# Patient Record
Sex: Male | Born: 1955 | Race: White | Hispanic: No | Marital: Married | Smoking: Never smoker
Health system: Southern US, Community
[De-identification: ages and names within clinical notes are randomized; demographics above are authoritative.]

## PROBLEM LIST (undated history)

## (undated) DIAGNOSIS — F341 Dysthymic disorder: Secondary | ICD-10-CM

## (undated) DIAGNOSIS — E559 Vitamin D deficiency, unspecified: Secondary | ICD-10-CM

## (undated) DIAGNOSIS — F0280 Dementia in other diseases classified elsewhere without behavioral disturbance: Secondary | ICD-10-CM

## (undated) DIAGNOSIS — E291 Testicular hypofunction: Secondary | ICD-10-CM

## (undated) DIAGNOSIS — C439 Malignant melanoma of skin, unspecified: Secondary | ICD-10-CM

## (undated) DIAGNOSIS — G629 Polyneuropathy, unspecified: Secondary | ICD-10-CM

## (undated) DIAGNOSIS — R413 Other amnesia: Secondary | ICD-10-CM

## (undated) DIAGNOSIS — H9190 Unspecified hearing loss, unspecified ear: Secondary | ICD-10-CM

## (undated) DIAGNOSIS — E7800 Pure hypercholesterolemia, unspecified: Secondary | ICD-10-CM

## (undated) HISTORY — DX: Malignant melanoma of skin, unspecified: C43.9

## (undated) HISTORY — DX: Dementia in other diseases classified elsewhere, unspecified severity, without behavioral disturbance, psychotic disturbance, mood disturbance, and anxiety: F02.80

## (undated) HISTORY — DX: Dysthymic disorder: F34.1

## (undated) HISTORY — DX: Pure hypercholesterolemia, unspecified: E78.00

## (undated) HISTORY — DX: Polyneuropathy, unspecified: G62.9

## (undated) HISTORY — DX: Vitamin D deficiency, unspecified: E55.9

## (undated) HISTORY — DX: Testicular hypofunction: E29.1

## (undated) HISTORY — PX: MELANOMA EXCISION: SHX5266

## (undated) HISTORY — DX: Unspecified hearing loss, unspecified ear: H91.90

## (undated) HISTORY — DX: Other amnesia: R41.3

---

## 2012-09-15 DIAGNOSIS — G629 Polyneuropathy, unspecified: Secondary | ICD-10-CM | POA: Insufficient documentation

## 2014-03-30 DIAGNOSIS — Q453 Other congenital malformations of pancreas and pancreatic duct: Secondary | ICD-10-CM | POA: Insufficient documentation

## 2015-09-01 DIAGNOSIS — E291 Testicular hypofunction: Secondary | ICD-10-CM | POA: Insufficient documentation

## 2016-08-28 DIAGNOSIS — F341 Dysthymic disorder: Secondary | ICD-10-CM | POA: Insufficient documentation

## 2018-06-02 DIAGNOSIS — Z86018 Personal history of other benign neoplasm: Secondary | ICD-10-CM

## 2018-06-02 HISTORY — DX: Personal history of other benign neoplasm: Z86.018

## 2019-01-05 ENCOUNTER — Encounter: Payer: Self-pay | Admitting: *Deleted

## 2019-01-09 ENCOUNTER — Encounter: Payer: Self-pay | Admitting: Neurology

## 2019-01-09 ENCOUNTER — Ambulatory Visit (INDEPENDENT_AMBULATORY_CARE_PROVIDER_SITE_OTHER): Payer: Medicare Other | Admitting: Neurology

## 2019-01-09 DIAGNOSIS — G3184 Mild cognitive impairment, so stated: Secondary | ICD-10-CM

## 2019-01-09 NOTE — Progress Notes (Signed)
PATIENT: Frederick Cook DOB: 12/07/1956  Chief Complaint  Patient presents with  . Mild Cognitive Impairment    MMSE 26/30 - 13 animals.  He is here with his wife, Santiago Glad, to establish new care.  Recently moved from [place].   Marland Kitchen PCP    Tisovec, Fransico Him, MD     HISTORICAL  Frederick Cook is a 63 year old male, accompanied by his wife Santiago Glad, seen in request by his primary care physician Dr. Osborne Casco, Fransico Him for evaluation of cognitive impairment, initial evaluation was on January 09, 2019  I have reviewed and summarized the referring note from the referring physician.  He had a history of peripheral neuropathy, vitamin D deficiency, hypogonadism, melanoma scalp, back of neck, recently moved to [place] from [place] in August 2019.  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 medications, he denies significant improvement in his memory loss, had slow decline in the past few years, he is no longer working.  I also reviewed previous evaluation note by Dr. 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, medial frontal cortex and parietal cortex MRI of the brain port from outside hospital in September 2019 no acute abnormality EEG July 23, 2017 was normal,  Moca score was 24/20, missed 5 out of 5 recall  Neuropsychiatric evaluation in 2018, identifies significant impairment in multiple cognitive domains, mild impairment of fine motor speed and coordination of his dominant right hand, visual attention, semantic and  phonemic verbal fluency, sustained attention, processing speed, anterograde memory, for both verbal and visual spatial information, he also reported several symptoms of depression, when compared to previous 2016 neuropsychological result, notable declines in the following cognitive domains, sustained attention, language, verbal learning, verbal fluency.  He just finished previous dementia trial in December 2019,  Strong family history of dementia, his mother, maternal grand father suffered dementia,   REVIEW OF SYSTEMS: Full 14 system review of systems performed and notable only for hearing loss, memory loss All other review of systems were negative.  ALLERGIES: No Known Allergies  HOME MEDICATIONS: No current outpatient medications on file.   No current facility-administered medications for this visit.     PAST MEDICAL HISTORY: Past Medical History:  Diagnosis Date  . Dysthymia   . Hearing loss   . High cholesterol   . Hypogonadism in male   . Melanoma ([place])    scalp, back of neck  . Memory loss   . Neuropathy   . Vitamin D deficiency     PAST SURGICAL HISTORY: Past Surgical History:  Procedure Laterality Date  . MELANOMA EXCISION     x 4    FAMILY HISTORY: Family History  Problem Relation Age of Onset  . High Cholesterol Mother   . Alzheimer's disease Mother   . Hypertension Father   . Heart disease Father        pacemaker  . Breast cancer Sister   . Bladder Cancer Sister     SOCIAL HISTORY: Social History   Socioeconomic History  . Marital status: Married  Spouse name: Not on file  . Number of children: 2  . Years of education: MBA  . Highest education level: Master's degree (e.g., MA, MS, MEng, MEd, MSW, MBA)  Occupational History  . Occupation: Retired  Scientific laboratory technician  . Financial resource strain: Not on file  . Food insecurity:    Worry: Not on file    Inability: Not on file  . Transportation needs:    Medical: Not on file    Non-medical: Not  on file  Tobacco Use  . Smoking status: Never Smoker  . Smokeless tobacco: Never Used  Substance and Sexual Activity  . Alcohol use: Yes    Comment: one drink per week  . Drug use: Never  . Sexual activity: Not on file  Lifestyle  . Physical activity:    Days per week: Not on file    Minutes per session: Not on file  . Stress: Not on file  Relationships  . Social connections:    Talks on phone: Not on file    Gets together: Not on file    Attends religious service: Not on file    Active member of club or organization: Not on file    Attends meetings of clubs or organizations: Not on file    Relationship status: Not on file  . Intimate partner violence:    Fear of current or ex partner: Not on file    Emotionally abused: Not on file    Physically abused: Not on file    Forced sexual activity: Not on file  Other Topics Concern  . Not on file  Social History Narrative   Left-handed.   Lives at home with his wife, Santiago Glad.   3 cups caffeine per day.     PHYSICAL EXAM   Vitals:   01/09/19 0803  Weight: 185 lb 8 oz (84.1 kg)  Height: 6\' 3"  (1.905 m)    Not recorded      Body mass index is 23.19 kg/m.  PHYSICAL EXAMNIATION:  Gen: NAD, conversant, well nourised, obese, well groomed                     Cardiovascular: Regular rate rhythm, no peripheral edema, warm, nontender. Eyes: Conjunctivae clear without exudates or hemorrhage Neck: Supple, no carotid bruits. Pulmonary: Clear to auscultation bilaterally   NEUROLOGICAL EXAM:  MMSE - Mini Mental State Exam 01/09/2019  Orientation to time 4  Orientation to Place 5  Registration 3  Attention/ Calculation 5  Recall 0  Language- name 2 objects 2  Language- repeat 1  Language- follow 3 step command 3  Language- read & follow direction 1  Write a sentence 1  Copy design 1  Total score 26  animal naming 14   CRANIAL NERVES: CN II: Visual fields are full to confrontation. Fundoscopic exam is normal with sharp  discs and no vascular changes. Pupils are round equal and briskly reactive to light. CN III, IV, VI: extraocular movement are normal. No ptosis. CN V: Facial sensation is intact to pinprick in all 3 divisions bilaterally. Corneal responses are intact.  CN VII: Face is symmetric with normal eye closure and smile. CN VIII: Hearing is normal to rubbing fingers CN IX, X: Palate elevates symmetrically. Phonation is normal. CN XI: Head turning and shoulder shrug are intact CN XII: Tongue is midline with normal movements and no atrophy.  MOTOR: There is no pronator drift of out-stretched arms. Muscle bulk and tone are normal. Muscle strength  is normal.  REFLEXES: Reflexes are 2+ and symmetric at the biceps, triceps, knees, and ankles. Plantar responses are flexor.  SENSORY: Intact to light touch, pinprick, positional sensation and vibratory sensation are intact in fingers and toes.  COORDINATION: Rapid alternating movements and fine finger movements are intact. There is no dysmetria on finger-to-nose and heel-knee-shin.    GAIT/STANCE: Posture is normal. Gait is steady with normal steps, base, arm swing, and turning. Heel and toe walking are normal. Tandem gait is normal.  Romberg is absent.   DIAGNOSTIC DATA (LABS, IMAGING, TESTING) - I reviewed patient records, labs, notes, testing and imaging myself where available.   ASSESSMENT AND PLAN  Kamerin Grumbine is a 63 y.o. male   Dementia  Strong family history of dementia, mother, maternal grandfather suffered dementia,  Was enrolled in a clinical trial in the past, had positive PET scan for amyloid,  Neuropsychological evaluation also confirmed progression of cognitive impairment,  I have encouraged him continue moderate exercise, laboratory evaluation by his primary care physician  May consider Namenda, Aricept once his insurance issue is clarified   Marcial Pacas, M.D. Ph.D.  [facility] [address] [place], [place] [address] Ph: [phone] Fax: [phone]  CC: Tisovec, Fransico Him, MD

## 2019-04-28 DIAGNOSIS — G3184 Mild cognitive impairment, so stated: Secondary | ICD-10-CM | POA: Diagnosis not present

## 2019-04-28 DIAGNOSIS — E7800 Pure hypercholesterolemia, unspecified: Secondary | ICD-10-CM | POA: Diagnosis not present

## 2019-04-28 DIAGNOSIS — R946 Abnormal results of thyroid function studies: Secondary | ICD-10-CM | POA: Diagnosis not present

## 2019-04-28 DIAGNOSIS — F341 Dysthymic disorder: Secondary | ICD-10-CM | POA: Diagnosis not present

## 2019-04-28 DIAGNOSIS — E291 Testicular hypofunction: Secondary | ICD-10-CM | POA: Diagnosis not present

## 2019-04-28 DIAGNOSIS — E559 Vitamin D deficiency, unspecified: Secondary | ICD-10-CM | POA: Diagnosis not present

## 2019-05-01 DIAGNOSIS — R946 Abnormal results of thyroid function studies: Secondary | ICD-10-CM | POA: Diagnosis not present

## 2019-06-08 DIAGNOSIS — Z85828 Personal history of other malignant neoplasm of skin: Secondary | ICD-10-CM | POA: Diagnosis not present

## 2019-06-08 DIAGNOSIS — Z1283 Encounter for screening for malignant neoplasm of skin: Secondary | ICD-10-CM | POA: Diagnosis not present

## 2019-06-08 DIAGNOSIS — Z85820 Personal history of malignant melanoma of skin: Secondary | ICD-10-CM | POA: Diagnosis not present

## 2019-06-08 DIAGNOSIS — D045 Carcinoma in situ of skin of trunk: Secondary | ICD-10-CM | POA: Diagnosis not present

## 2019-06-08 DIAGNOSIS — C4492 Squamous cell carcinoma of skin, unspecified: Secondary | ICD-10-CM

## 2019-06-08 DIAGNOSIS — D485 Neoplasm of uncertain behavior of skin: Secondary | ICD-10-CM | POA: Diagnosis not present

## 2019-06-08 DIAGNOSIS — D2239 Melanocytic nevi of other parts of face: Secondary | ICD-10-CM | POA: Diagnosis not present

## 2019-06-08 HISTORY — DX: Squamous cell carcinoma of skin, unspecified: C44.92

## 2019-07-20 DIAGNOSIS — D045 Carcinoma in situ of skin of trunk: Secondary | ICD-10-CM | POA: Diagnosis not present

## 2019-07-20 DIAGNOSIS — L720 Epidermal cyst: Secondary | ICD-10-CM | POA: Diagnosis not present

## 2019-08-11 DIAGNOSIS — Z23 Encounter for immunization: Secondary | ICD-10-CM | POA: Diagnosis not present

## 2019-09-06 ENCOUNTER — Inpatient Hospital Stay (HOSPITAL_BASED_OUTPATIENT_CLINIC_OR_DEPARTMENT_OTHER)
Admission: EM | Admit: 2019-09-06 | Discharge: 2019-09-08 | DRG: 312 | Disposition: A | Discharge status: Home / Self Care | Payer: Medicare Other | Attending: Internal Medicine | Admitting: Internal Medicine

## 2019-09-06 ENCOUNTER — Emergency Department (HOSPITAL_BASED_OUTPATIENT_CLINIC_OR_DEPARTMENT_OTHER): Payer: Medicare Other

## 2019-09-06 ENCOUNTER — Other Ambulatory Visit: Payer: Self-pay

## 2019-09-06 ENCOUNTER — Encounter (HOSPITAL_BASED_OUTPATIENT_CLINIC_OR_DEPARTMENT_OTHER): Payer: Self-pay

## 2019-09-06 DIAGNOSIS — H9190 Unspecified hearing loss, unspecified ear: Secondary | ICD-10-CM | POA: Diagnosis present

## 2019-09-06 DIAGNOSIS — Z803 Family history of malignant neoplasm of breast: Secondary | ICD-10-CM

## 2019-09-06 DIAGNOSIS — I951 Orthostatic hypotension: Secondary | ICD-10-CM | POA: Diagnosis not present

## 2019-09-06 DIAGNOSIS — Z8673 Personal history of transient ischemic attack (TIA), and cerebral infarction without residual deficits: Secondary | ICD-10-CM

## 2019-09-06 DIAGNOSIS — F0280 Dementia in other diseases classified elsewhere without behavioral disturbance: Secondary | ICD-10-CM | POA: Diagnosis not present

## 2019-09-06 DIAGNOSIS — S0181XA Laceration without foreign body of other part of head, initial encounter: Secondary | ICD-10-CM | POA: Diagnosis present

## 2019-09-06 DIAGNOSIS — E7800 Pure hypercholesterolemia, unspecified: Secondary | ICD-10-CM | POA: Diagnosis not present

## 2019-09-06 DIAGNOSIS — S0180XA Unspecified open wound of other part of head, initial encounter: Secondary | ICD-10-CM | POA: Diagnosis not present

## 2019-09-06 DIAGNOSIS — R55 Syncope and collapse: Secondary | ICD-10-CM | POA: Diagnosis present

## 2019-09-06 DIAGNOSIS — Z820 Family history of epilepsy and other diseases of the nervous system: Secondary | ICD-10-CM | POA: Diagnosis not present

## 2019-09-06 DIAGNOSIS — E860 Dehydration: Secondary | ICD-10-CM | POA: Diagnosis present

## 2019-09-06 DIAGNOSIS — G300 Alzheimer's disease with early onset: Secondary | ICD-10-CM | POA: Diagnosis present

## 2019-09-06 DIAGNOSIS — Z8052 Family history of malignant neoplasm of bladder: Secondary | ICD-10-CM

## 2019-09-06 DIAGNOSIS — Z8349 Family history of other endocrine, nutritional and metabolic diseases: Secondary | ICD-10-CM | POA: Diagnosis not present

## 2019-09-06 DIAGNOSIS — Y92009 Unspecified place in unspecified non-institutional (private) residence as the place of occurrence of the external cause: Secondary | ICD-10-CM

## 2019-09-06 DIAGNOSIS — R7989 Other specified abnormal findings of blood chemistry: Secondary | ICD-10-CM | POA: Diagnosis not present

## 2019-09-06 DIAGNOSIS — R413 Other amnesia: Secondary | ICD-10-CM | POA: Diagnosis not present

## 2019-09-06 DIAGNOSIS — E291 Testicular hypofunction: Secondary | ICD-10-CM | POA: Diagnosis present

## 2019-09-06 DIAGNOSIS — W19XXXA Unspecified fall, initial encounter: Secondary | ICD-10-CM | POA: Diagnosis present

## 2019-09-06 DIAGNOSIS — Z8249 Family history of ischemic heart disease and other diseases of the circulatory system: Secondary | ICD-10-CM

## 2019-09-06 DIAGNOSIS — Z03818 Encounter for observation for suspected exposure to other biological agents ruled out: Secondary | ICD-10-CM | POA: Diagnosis not present

## 2019-09-06 DIAGNOSIS — E785 Hyperlipidemia, unspecified: Secondary | ICD-10-CM | POA: Diagnosis present

## 2019-09-06 DIAGNOSIS — Z20828 Contact with and (suspected) exposure to other viral communicable diseases: Secondary | ICD-10-CM | POA: Diagnosis present

## 2019-09-06 DIAGNOSIS — S0990XA Unspecified injury of head, initial encounter: Secondary | ICD-10-CM | POA: Diagnosis not present

## 2019-09-06 DIAGNOSIS — W19XXXD Unspecified fall, subsequent encounter: Secondary | ICD-10-CM | POA: Diagnosis not present

## 2019-09-06 DIAGNOSIS — Z85820 Personal history of malignant melanoma of skin: Secondary | ICD-10-CM

## 2019-09-06 DIAGNOSIS — R079 Chest pain, unspecified: Secondary | ICD-10-CM | POA: Diagnosis not present

## 2019-09-06 LAB — CBC
HCT: 40.5 % (ref 39.0–52.0)
Hemoglobin: 13.8 g/dL (ref 13.0–17.0)
MCH: 31.7 pg (ref 26.0–34.0)
MCHC: 34.1 g/dL (ref 30.0–36.0)
MCV: 92.9 fL (ref 80.0–100.0)
Platelets: 180 10*3/uL (ref 150–400)
RBC: 4.36 MIL/uL (ref 4.22–5.81)
RDW: 12.1 % (ref 11.5–15.5)
WBC: 10.3 10*3/uL (ref 4.0–10.5)
nRBC: 0 % (ref 0.0–0.2)

## 2019-09-06 LAB — CBG MONITORING, ED: Glucose-Capillary: 94 mg/dL (ref 70–99)

## 2019-09-06 LAB — SARS CORONAVIRUS 2 BY RT PCR (HOSPITAL ORDER, PERFORMED IN ~~LOC~~ HOSPITAL LAB): SARS Coronavirus 2: NEGATIVE

## 2019-09-06 LAB — URINALYSIS, ROUTINE W REFLEX MICROSCOPIC
Bilirubin Urine: NEGATIVE
Glucose, UA: NEGATIVE mg/dL
Hgb urine dipstick: NEGATIVE
Ketones, ur: NEGATIVE mg/dL
Leukocytes,Ua: NEGATIVE
Nitrite: NEGATIVE
Protein, ur: NEGATIVE mg/dL
Specific Gravity, Urine: 1.02 (ref 1.005–1.030)
pH: 6 (ref 5.0–8.0)

## 2019-09-06 LAB — TROPONIN I (HIGH SENSITIVITY)
Troponin I (High Sensitivity): 2 ng/L (ref <18)
Troponin I (High Sensitivity): 2 ng/L (ref <18)

## 2019-09-06 LAB — BASIC METABOLIC PANEL
Anion gap: 10 (ref 5–15)
BUN: 18 mg/dL (ref 8–23)
CO2: 26 mmol/L (ref 22–32)
Calcium: 9.7 mg/dL (ref 8.9–10.3)
Chloride: 100 mmol/L (ref 98–111)
Creatinine, Ser: 0.89 mg/dL (ref 0.61–1.24)
GFR calc Af Amer: >60 mL/min (ref >60)
GFR calc non Af Amer: >60 mL/min (ref >60)
Glucose, Bld: 108 mg/dL — ABNORMAL HIGH (ref 70–99)
Potassium: 4.5 mmol/L (ref 3.5–5.1)
Sodium: 136 mmol/L (ref 135–145)

## 2019-09-06 LAB — D-DIMER, QUANTITATIVE: D-Dimer, Quant: 1.1 ug/mL-FEU — ABNORMAL HIGH (ref 0.00–0.50)

## 2019-09-06 LAB — MRSA PCR SCREENING: MRSA by PCR: NEGATIVE

## 2019-09-06 MED ORDER — SODIUM CHLORIDE 0.9% FLUSH
3.0000 mL | Freq: Once | INTRAVENOUS | Status: DC
  Filled 2019-09-06: qty 3

## 2019-09-06 MED ORDER — SODIUM CHLORIDE 0.9 % IV SOLN
INTRAVENOUS | Status: DC
  Administered 2019-09-07 – 2019-09-08 (×4): via INTRAVENOUS

## 2019-09-06 MED ORDER — ONDANSETRON HCL 4 MG PO TABS: 4.0000 mg | ORAL_TABLET | Freq: Four times a day (QID) | ORAL | Status: DC | PRN

## 2019-09-06 MED ORDER — ACETAMINOPHEN 325 MG PO TABS: 650.0000 mg | ORAL_TABLET | Freq: Four times a day (QID) | ORAL | Status: DC | PRN

## 2019-09-06 MED ORDER — LIDOCAINE HCL 2 % IJ SOLN
10.0000 mL | Freq: Once | INTRAMUSCULAR | Status: AC
  Administered 2019-09-06: 200 mg
  Filled 2019-09-06: qty 20

## 2019-09-06 MED ORDER — ACETAMINOPHEN 650 MG RE SUPP: 650.0000 mg | Freq: Four times a day (QID) | RECTAL | Status: DC | PRN

## 2019-09-06 MED ORDER — ENOXAPARIN SODIUM 40 MG/0.4ML ~~LOC~~ SOLN: 40.0000 mg | SUBCUTANEOUS | Status: DC

## 2019-09-06 MED ORDER — ONDANSETRON HCL 4 MG/2ML IJ SOLN: 4.0000 mg | Freq: Four times a day (QID) | INTRAMUSCULAR | Status: DC | PRN

## 2019-09-06 MED ORDER — SODIUM CHLORIDE 0.9% FLUSH
3.0000 mL | Freq: Two times a day (BID) | INTRAVENOUS | Status: DC
  Administered 2019-09-07: 3 mL via INTRAVENOUS

## 2019-09-06 MED ORDER — IOHEXOL 350 MG/ML SOLN
100.0000 mL | Freq: Once | INTRAVENOUS | Status: AC
  Administered 2019-09-06: 100 mL via INTRAVENOUS

## 2019-09-06 NOTE — H&P (Signed)
History and Physical    Frederick Cook [id number] DOB: January 13, 1956 DOA: 09/06/2019  Referring MD/NP/PA:   PCP: Haywood Pao, MD   Patient coming from:  The patient is coming from home.  At baseline, pt is independent for most of ADL.        Chief Complaint: syncope  HPI: Frederick Cook is a 63 y.o. male with medical history significant of HLD, memory loss, hypogonadism, who presents with syncope.  Pt states that he passed out for less than 1 minwhen he was standing in front of sink after taking a shower. He fall and injured his forehead, caused laceration to his forehead. Patient denies any prodromal symptoms.  No unilateral weakness, numbness or tingling his extremities been no facial droop, slurred speech, vision loss or new hearing change. No seizure activity.  Patient denies any chest pain, shortness of breath, cough, fever or chills.  No nausea vomiting, diarrhea, abdominal pain, symptoms of UTI.  Patient states that he has history of orthostatic status. He states that had a negative MRI of brain last week in [facility] (I could not find the report).  ED Course: pt was found to have negative troponin x2, negative COVID-19 test, d-dimer 1.10, WBC 10.3, electrolytes renal function okay, temperature normal, blood pressure 106/89, heart rate 57, 82, oxygen saturation 100% on room air.  Chest x-ray negative.  CT angiogram is negative for PE.  Patient is placed on telemetry bed for observation.  # CT-head: Frontal and parietal lobe atrophy. Minimal periventricular small vessel disease. 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 No acute infarct evident. No mass or hemorrhage.  Review of Systems:   General: no fevers, chills, no body weight gain, has fatigue HEENT: no blurry vision, hearing changes or sore throat Respiratory: no dyspnea, coughing, wheezing CV: no chest pain, no palpitations GI: no nausea, vomiting, abdominal pain, diarrhea,  constipation GU: no dysuria, burning on urination, increased urinary frequency, hematuria  Ext: no leg edema Neuro: no unilateral weakness, numbness, or tingling, no vision change or hearing loss. Had syncope and fall. Skin: no rash, has laceration to forehead MSK: No muscle spasm, no deformity, no limitation of range of movement in spin Heme: No easy bruising.  Travel history: No recent long distant travel.  Allergy: No Known Allergies  Past Medical History:  Diagnosis Date   Dysthymia    Hearing loss    High cholesterol    Hypogonadism in male    Melanoma ([place])    scalp, back of neck   Memory loss    Neuropathy    Vitamin D deficiency     Past Surgical History:  Procedure Laterality Date   MELANOMA EXCISION     x 4    Social History:  reports that he has never smoked. He has never used smokeless tobacco. He reports current alcohol use. He reports that he does not use drugs.  Family History:  Family History  Problem Relation Age of Onset   High Cholesterol Mother    Alzheimer's disease Mother    Hypertension Father    Heart disease Father        pacemaker   Breast cancer Sister    Bladder Cancer Sister      Prior to Admission medications   Not on File    Physical Exam: Vitals:   09/06/19 2020 09/06/19 2100 09/06/19 2154 09/06/19 2334  BP: 125/76 125/69 106/89 111/71  Pulse: (!) 57 (!) 59  82 63  Resp: 20 18 12 19   Temp:   98.2 F (36.8 C) 98 F (36.7 C)  TempSrc:   Oral Oral  SpO2: 100% 100% 100% 98%  Weight:   81.3 kg   Height:   6\' 5"  (1.956 m)    General: Not in acute distress HEENT:       Eyes: PERRL, EOMI, no scleral icterus.       ENT: No discharge from the ears and nose, no pharynx injection, no tonsillar enlargement.        Neck: No JVD, no bruit, no mass felt. Heme: No neck lymph node enlargement. Cardiac: S1/S2, RRR, No murmurs, No gallops or rubs. Respiratory: Good air movement bilaterally. No rales, wheezing, rhonchi or  rubs. GI: Soft, nondistended, nontender, no rebound pain, no organomegaly, BS present. GU: No hematuria Ext: No pitting leg edema bilaterally. 2+DP/PT pulse bilaterally. Musculoskeletal: No joint deformities, No joint redness or warmth, no limitation of ROM in spin. Skin: No rashes. has laceration to forehead. Neuro: Alert, oriented X3, cranial nerves II-XII grossly intact, moves all extremities normally. Muscle strength 5/5 in all extremities, sensation to light touch intact. Brachial reflex 2+ bilaterally. Negative Babinski's sign. Normal finger to nose test. Psych: Patient is not psychotic, no suicidal or hemocidal ideation.  Labs on Admission: I have personally reviewed following labs and imaging studies  CBC: Recent Labs  Lab 09/06/19 1447  WBC 10.3  HGB 13.8  HCT 40.5  MCV 92.9  PLT 99991111   Basic Metabolic Panel: Recent Labs  Lab 09/06/19 1447  NA 136  K 4.5  CL 100  CO2 26  GLUCOSE 108*  BUN 18  CREATININE 0.89  CALCIUM 9.7   GFR: Estimated Creatinine Clearance: 95.2 mL/min (by C-G formula based on SCr of 0.89 mg/dL). Liver Function Tests: No results for input(s): AST, ALT, ALKPHOS, BILITOT, PROT, ALBUMIN in the last 168 hours. No results for input(s): LIPASE, AMYLASE in the last 168 hours. No results for input(s): AMMONIA in the last 168 hours. Coagulation Profile: No results for input(s): INR, PROTIME in the last 168 hours. Cardiac Enzymes: No results for input(s): CKTOTAL, CKMB, CKMBINDEX, TROPONINI in the last 168 hours. BNP (last 3 results) No results for input(s): PROBNP in the last 8760 hours. HbA1C: No results for input(s): HGBA1C in the last 72 hours. CBG: Recent Labs  Lab 09/06/19 1451  GLUCAP 94   Lipid Profile: No results for input(s): CHOL, HDL, LDLCALC, TRIG, CHOLHDL, LDLDIRECT in the last 72 hours. Thyroid Function Tests: No results for input(s): TSH, T4TOTAL, FREET4, T3FREE, THYROIDAB in the last 72 hours. Anemia Panel: No results for  input(s): VITAMINB12, FOLATE, FERRITIN, TIBC, IRON, RETICCTPCT in the last 72 hours. Urine analysis:    Component Value Date/Time   COLORURINE YELLOW 09/06/2019 [place] 09/06/2019 1447   LABSPEC 1.020 09/06/2019 1447   PHURINE 6.0 09/06/2019 1447   GLUCOSEU NEGATIVE 09/06/2019 1447   HGBUR NEGATIVE 09/06/2019 [place] 09/06/2019 [place] 09/06/2019 1447   PROTEINUR NEGATIVE 09/06/2019 1447   NITRITE NEGATIVE 09/06/2019 1447   LEUKOCYTESUR NEGATIVE 09/06/2019 1447   Sepsis Labs: @LABRCNTIP (procalcitonin:4,lacticidven:4) ) Recent Results (from the past 240 hour(s))  SARS Coronavirus 2 [facility] Series order, Performed in [facility] hospital lab) Nasopharyngeal Nasopharyngeal Swab     Status: None   Collection Time: 09/06/19  3:49 PM   Specimen: Nasopharyngeal Swab  Result Value Ref Range Status   SARS Coronavirus 2 NEGATIVE NEGATIVE Final  Comment: (NOTE) If result is NEGATIVE SARS-CoV-2 target nucleic acids are NOT DETECTED. The SARS-CoV-2 RNA is generally detectable in upper and lower  respiratory specimens during the acute phase of infection. The lowest  concentration of SARS-CoV-2 viral copies this assay can detect is 250  copies / mL. A negative result does not preclude SARS-CoV-2 infection  and should not be used as the sole basis for treatment or other  patient management decisions.  A negative result may occur with  improper specimen collection / handling, submission of specimen other  than nasopharyngeal swab, presence of viral mutation(s) within the  areas targeted by this assay, and inadequate number of viral copies  (<250 copies / mL). A negative result must be combined with clinical  observations, patient history, and epidemiological information. If result is POSITIVE SARS-CoV-2 target nucleic acids are DETECTED. The SARS-CoV-2 RNA is generally detectable in upper and lower  respiratory specimens dur ing the acute  phase of infection.  Positive  results are indicative of active infection with SARS-CoV-2.  Clinical  correlation with patient history and other diagnostic information is  necessary to determine patient infection status.  Positive results do  not rule out bacterial infection or co-infection with other viruses. If result is PRESUMPTIVE POSTIVE SARS-CoV-2 nucleic acids MAY BE PRESENT.   A presumptive positive result was obtained on the submitted specimen  and confirmed on repeat testing.  While 2019 novel coronavirus  (SARS-CoV-2) nucleic acids may be present in the submitted sample  additional confirmatory testing may be necessary for epidemiological  and / or clinical management purposes  to differentiate between  SARS-CoV-2 and other Sarbecovirus currently known to infect humans.  If clinically indicated additional testing with an alternate test  methodology [phone]) is advised. The SARS-CoV-2 RNA is generally  detectable in upper and lower respiratory sp ecimens during the acute  phase of infection. The expected result is Negative. Fact Sheet for Patients:  StrictlyIdeas.no Fact Sheet for Healthcare Providers: BankingDealers.co.za This test is not yet approved or cleared by the [place] FDA and has been authorized for detection and/or diagnosis of SARS-CoV-2 by FDA under an Emergency Use Authorization (EUA).  This EUA will remain in effect (meaning this test can be used) for the duration of the COVID-19 declaration under Section 564(b)(1) of the Act, 21 U.S.C. section 360bbb-3(b)(1), unless the authorization is terminated or revoked sooner. Performed at [facility], [address] [place], [place] [address]   MRSA PCR Screening     Status: None   Collection Time: 09/06/19  9:54 PM   Specimen: Nasal Mucosa; Nasopharyngeal  Result Value Ref Range Status   MRSA by PCR NEGATIVE NEGATIVE Final    Comment:        The  GeneXpert MRSA Assay (FDA approved for NASAL specimens only), is one component of a comprehensive MRSA colonization surveillance program. It is not intended to diagnose MRSA infection nor to guide or monitor treatment for MRSA infections. Performed at [facility], [place] [address] [place], [place] [address]      Radiological Exams on Admission: Dg Chest 2 View  Result Date: 09/06/2019 CLINICAL DATA:  63 year old male with syncope. EXAM: CHEST - 2 VIEW COMPARISON:  None. FINDINGS: The heart size and mediastinal contours are within normal limits. Both lungs are clear. The visualized skeletal structures are unremarkable. IMPRESSION: No active cardiopulmonary disease. Electronically Signed   By: Anner Crete M.D.   On: 09/06/2019 15:39   Ct Head Wo  Contrast  Result Date: 09/06/2019 CLINICAL DATA:  Syncope with fall EXAM: CT HEAD WITHOUT CONTRAST TECHNIQUE: Contiguous axial images were obtained from the base of the skull through the vertex without intravenous contrast. COMPARISON:  None. FINDINGS: Brain: There is moderate frontal and parietal lobe atrophy bilaterally. The ventricles are normal in size and configuration for age. There is no intracranial mass, hemorrhage, extra-axial fluid collection, or midline shift. There is rather minimal small vessel disease in the centra semiovale bilaterally. There is evidence of a prior small lacunar type infarct in the anterior limb of the right internal capsule. No acute infarct evident. Vascular: There is no hyperdense vessel. There is no appreciable vascular calcification. Skull: The bony calvarium appears intact. Sinuses/Orbits: Visualized paranasal sinuses are clear. Visualized orbits appear symmetric bilaterally. Other: Visualized mastoid air cells are clear. IMPRESSION: Frontal and parietal lobe atrophy. Minimal periventricular small vessel disease. 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 No acute infarct  evident. No mass or hemorrhage. Electronically Signed   By: Lowella Grip III M.D.   On: 09/06/2019 15:41   Ct Angio Chest Pe W/cm &/or Wo Cm  Result Date: 09/06/2019 CLINICAL DATA:  Chest pain EXAM: CT ANGIOGRAPHY CHEST WITH CONTRAST TECHNIQUE: Multidetector CT imaging of the chest was performed using the standard protocol during bolus administration of intravenous contrast. Multiplanar CT image reconstructions and MIPs were obtained to evaluate the vascular anatomy. CONTRAST:  150mL OMNIPAQUE IOHEXOL 350 MG/ML SOLN COMPARISON:  Chest radiograph September 06, 2019 FINDINGS: Cardiovascular: There is no demonstrable pulmonary embolus. There is no thoracic aortic aneurysm or dissection. Visualized great vessels appear normal. There are foci of aortic atherosclerosis. There is no pericardial effusion or pericardial thickening. Mediastinum/Nodes: There is a 5 mm nodular opacity in the inferior aspect of the right lobe of the thyroid, a finding that does not warrant additional surveillance per consensus guidelines. Thyroid otherwise appears normal. There is no demonstrable thoracic adenopathy. No esophageal lesions are evident. Lungs/Pleura: There is no edema or consolidation. There is no appreciable pleural effusion. On axial slice 20 series 6, there is a 4 mm nodular opacity in the apical segment right upper lobe toward the posterior aspect. There is a small calcified granuloma in the posterior segment right upper lobe seen on axial slice 44 series 6. A second tiny calcified granuloma is seen in the right upper lobe anteriorly on axial slice 60 series 6. Upper Abdomen: There is left adrenal hypertrophy. Right adrenal appears normal. Visualized upper abdominal structures otherwise appear unremarkable. Musculoskeletal: There are no blastic or lytic bone lesions. No evident chest wall lesions. There are foci of degenerative change in the thoracic spine. Review of the MIP images confirms the above findings.  IMPRESSION: 1. No demonstrable pulmonary embolus. No thoracic aortic aneurysm or dissection. There are foci of aortic atherosclerosis. 2. No edema or consolidation. Occasional small calcified granulomas. 4 mm noncalcified nodular opacity in the apical segment right upper lobe. No follow-up needed if patient is low-risk. Non-contrast chest CT can be considered in 12 months if patient is high-risk. This recommendation follows the consensus statement: Guidelines for Management of Incidental Pulmonary Nodules Detected on CT Images: From the Fleischner Society 2017; Radiology 2017; 284:228-243. 3.  No evident thoracic adenopathy. 4. Left adrenal hypertrophy. This is a finding of questionable clinical significance. Right adrenal appears normal. Aortic Atherosclerosis (ICD10-I70.0). Electronically Signed   By: Lowella Grip III M.D.   On: 09/06/2019 17:23     EKG: Independently reviewed.  Sinus rhythm, QTC 417, bradycardia, nonspecific T wave change.  Assessment/Plan Principal Problem:   Syncope Active Problems:   High cholesterol   Memory loss   Fall   Syncope: Etiology is not clear. The differential diagnosis is broad, including vasovagal syncope, TIA, arrhythmia, ACS (less likely, given no chest pain and has two negative trop), drug abuse, orthostatic status (pt states that he has history of orthostatic issue), carotid artery stenosis.  Patient states that he had a negative MRI last week in [facility] (I cannot find the report).  Patient is hesitated to do MRI of brain, which is fine to me since patient does not have any focal neurologic findings on physical examination.  - Place on tele bed for obs - Orthostatic vital signs  - Carotid doppler - 2d echo - Neuro checks  - IVF: NS 100 cc/h - PT/OT eval and treat - will get LE venous doppler to r/o DVT due to positive D-dimer  Fall: due to syncope. Has forehead laceration. -would care consult  High cholesterol: -continue home statin (not  ordered, since pt cannot tell the dose and name, pending med reconciliation)  Memory loss:  -f/u with neuro in [facility]  DVT ppx: SQ Lovenox Code Status: Full code Family Communication: None at bed side.   Disposition Plan:  Anticipate discharge back to previous home environment Consults called:  none Admission status: Obs / tele     Date of Service 09/07/2019    [facility]   If 7PM-7AM, please contact night-coverage [email or web address] Password [facility] 09/07/2019, 12:28 AM

## 2019-09-06 NOTE — ED Notes (Signed)
Call to unit to provide report, unable to take report at this time.  Pt to be admitted pending covid result.

## 2019-09-06 NOTE — ED Notes (Signed)
[facility] notified (Tara) - patient ready for transport 

## 2019-09-06 NOTE — ED Notes (Signed)
[facility] will page hospitalist to have Bed Request completed

## 2019-09-06 NOTE — ED Notes (Signed)
Patient transported to CT 

## 2019-09-06 NOTE — ED Triage Notes (Signed)
Pt with c/o "blacked" out while in the [place] today-last recall taking a shower and was brushing his teeth-pt states he called for her and found pt slumped over sink/diaphoretic-pt with lac to forehead-pt does not have recall of hitting head-sent from PCP-pt NAD-steady gait

## 2019-09-06 NOTE — ED Notes (Signed)
Gave patient crackers and gingerale

## 2019-09-06 NOTE — ED Provider Notes (Signed)
[facility] EMERGENCY DEPARTMENT Provider Note   CSN: [id number] Arrival date & time: 09/06/19  1406     History   Chief Complaint Chief Complaint  Patient presents with  . Loss of Consciousness    HPI Frederick Cook is a 63 y.o. male.     HPI Patient has been well without any recent illness and minimal past medical history.  He had taken a shower and was brushing his teeth.  He had not felt lightheaded or overheated while showering.  Reports he was not in a long, hot shower.  He reports very suddenly he felt himself falling toward the sink and struck his head on the faucet.  He was able to support himself on the vanity and did not fall completely to the floor.  Patient reports he did not have any type of prodromal symptoms.  He reports he "felt fine, and then he was not".  He did not perceive his vision closing in or feeling weak or sweaty.  He was able to call his wife for help.  She reports when she got there he was very diaphoretic but not confused.  Within a few minutes he was starting to recover.  He reports now he just feels a little bit weak and shaky but otherwise back to normal.  He denies there was ever a headache, chest pain or focal weakness numbness or tingling of the extremities.  No recent new medications.  He has been started on a cholesterol medication about a month ago.  Patient otherwise has had no cardiac history, does not take antihypertensives, no drug or alcohol history.  At baseline he walks about 6 miles 3 times a week.  He denies any gets exertional chest pain or shortness of breath.  He does not feel lightheaded with exertion at baseline. Past Medical History:  Diagnosis Date  . Dysthymia   . Hearing loss   . High cholesterol   . Hypogonadism in male   . Melanoma ([place])    scalp, back of neck  . Memory loss   . Neuropathy   . Vitamin D deficiency     Patient Active Problem List   Diagnosis Date Noted  . Mild cognitive impairment 01/09/2019     Past Surgical History:  Procedure Laterality Date  . MELANOMA EXCISION     x 4        Home Medications    Prior to Admission medications   Not on File    Family History Family History  Problem Relation Age of Onset  . High Cholesterol Mother   . Alzheimer's disease Mother   . Hypertension Father   . Heart disease Father        pacemaker  . Breast cancer Sister   . Bladder Cancer Sister     Social History Social History   Tobacco Use  . Smoking status: Never Smoker  . Smokeless tobacco: Never Used  Substance Use Topics  . Alcohol use: Yes    Comment: one drink per week  . Drug use: Never     Allergies   Patient has no known allergies.   Review of Systems Review of Systems 10 Systems reviewed and are negative for acute change except as noted in the HPI.   Physical Exam Updated Vital Signs BP 121/69 (BP Location: Right Arm)   Pulse 64   Temp 97.8 F (36.6 C) (Oral)   Resp 14   Ht 6\' 4"  (1.93 m)   Wt 82.6  kg   SpO2 100%   BMI 22.15 kg/m   Physical Exam Constitutional:      Appearance: Normal appearance. He is well-developed.  HENT:     Head:     Comments: 2 cm vertically oriented laceration just above the right brow on the glabella.  This is gaping but not actively bleeding.  No large hematoma associated.  Face otherwise nontraumatic.    Nose: Nose normal.     Mouth/Throat:     Mouth: Mucous membranes are moist.     Pharynx: Oropharynx is clear.  Eyes:     Extraocular Movements: Extraocular movements intact.     Conjunctiva/sclera: Conjunctivae normal.     Pupils: Pupils are equal, round, and reactive to light.  Neck:     Musculoskeletal: Neck supple.     Comments: No C-spine tenderness. Cardiovascular:     Rate and Rhythm: Normal rate and regular rhythm.     Heart sounds: Normal heart sounds.  Pulmonary:     Effort: Pulmonary effort is normal.     Breath sounds: Normal breath sounds.  Abdominal:     General: Bowel sounds are  normal. There is no distension.     Palpations: Abdomen is soft.     Tenderness: There is no abdominal tenderness.  Musculoskeletal: Normal range of motion.  Skin:    General: Skin is warm and dry.  Neurological:     General: No focal deficit present.     Mental Status: He is alert and oriented to person, place, and time.     GCS: GCS eye subscore is 4. GCS verbal subscore is 5. GCS motor subscore is 6.     Cranial Nerves: No cranial nerve deficit.     Sensory: No sensory deficit.     Coordination: Coordination normal.  Psychiatric:        Mood and Affect: Mood normal.      ED Treatments / Results  Labs (all labs ordered are listed, but only abnormal results are displayed) Labs Reviewed  BASIC METABOLIC PANEL  CBC  URINALYSIS, ROUTINE W REFLEX MICROSCOPIC  CBG MONITORING, ED    EKG EKG Interpretation  Date/Time:  Wednesday September 06 2019 14:25:17 EDT Ventricular Rate:  59 PR Interval:  180 QRS Duration: 92 QT Interval:  422 QTC Calculation: 417 R Axis:   78 Text Interpretation:  Sinus bradycardia Early repolarization Otherwise normal ECG agree. no old Confirmed by Charlesetta Shanks [phone]) on 09/06/2019 3:00:20 PM   Radiology No results found.  Procedures .Marland KitchenLaceration Repair  Date/Time: 09/06/2019 3:41 PM Performed by: Charlesetta Shanks, MD Authorized by: Charlesetta Shanks, MD   Consent:    Consent obtained:  Verbal   Consent given by:  Patient   Risks discussed:  Infection, pain, poor cosmetic result and poor wound healing Anesthesia (see MAR for exact dosages):    Anesthesia method:  Local infiltration   Local anesthetic:  Lidocaine 2% w/o epi Laceration details:    Location:  Face   Face location:  Forehead   Length (cm):  2   Depth (mm):  5 Repair type:    Repair type:  Simple Pre-procedure details:    Preparation:  Patient was prepped and draped in usual sterile fashion Exploration:    Wound extent: muscle damage     Contaminated: no   Treatment:     Area cleansed with:  Betadine and saline   Amount of cleaning:  Standard Skin repair:    Repair method:  Sutures   Suture  size:  5-0   Suture technique:  Simple interrupted   Number of sutures:  5 Approximation:    Approximation:  Close Post-procedure details:    Dressing:  Antibiotic ointment   (including critical care time)  Medications Ordered in ED Medications  sodium chloride flush (NS) 0.9 % injection 3 mL (has no administration in time range)  lidocaine (XYLOCAINE) 2 % (with pres) injection 200 mg (has no administration in time range)     Initial Impression / Assessment and Plan / ED Course  I have reviewed the triage vital signs and the nursing notes.  Pertinent labs & imaging results that were available during my care of the patient were reviewed by me and considered in my medical decision making (see chart for details).  Clinical Course as of Sep 06 1441  Wed Sep 06, 2019  1806 Consult: Reviewed with Dr. Tamala Julian, advises does not need primary cardiology admission but reasonable for monitored observation on medical service.   [MP]    Clinical Course User Index [MP] Charlesetta Shanks, MD      Patient had syncopal episode without any prodromal symptoms.  He had been in the shower but denies a long hot shower.  He had not been standing for significantly prolonged period of time.  Patient reports he was brushing his teeth and with no preceding symptoms collapsed onto the sink top.  His mental status was oriented after the event.  Concern for possible cardiac dysrhythmia as etiology.  EKG shows early repolarization pattern.  No old available for comparison.  Patient has been stable emergency department.  Will plan for admission.  Facial laceration repaired as outlined.  Final Clinical Impressions(s) / ED Diagnoses   Final diagnoses:  Syncope, unspecified syncope type  Facial laceration, initial encounter    ED Discharge Orders    None       Charlesetta Shanks, MD  09/07/19 1444

## 2019-09-06 NOTE — ED Notes (Signed)
ED Provider at bedside. 

## 2019-09-06 NOTE — Progress Notes (Signed)
   Transfer from [facility] to [facility]  Mr. Frederick Cook is a 63 year old Caucasian male with no significant past medical history had an episode of syncope this morning.  Denies prodromal symptoms.  Basic labs such as CBC, BMP: WNL.  Troponin: Negative x2.  Chest x-ray: Negative.  CT head: Chronic changes, no acute findings.  D-dimer slightly elevated.  CT angiogram of the chest obtained which showed no pulmonary embolism.  EDP spoke to cardiology who recommended medical management and observation overnight for dysrhythmia.

## 2019-09-07 ENCOUNTER — Ambulatory Visit (HOSPITAL_BASED_OUTPATIENT_CLINIC_OR_DEPARTMENT_OTHER): Payer: Medicare Other

## 2019-09-07 ENCOUNTER — Observation Stay (HOSPITAL_BASED_OUTPATIENT_CLINIC_OR_DEPARTMENT_OTHER): Payer: Medicare Other

## 2019-09-07 DIAGNOSIS — I951 Orthostatic hypotension: Secondary | ICD-10-CM | POA: Diagnosis present

## 2019-09-07 DIAGNOSIS — Z85820 Personal history of malignant melanoma of skin: Secondary | ICD-10-CM | POA: Diagnosis not present

## 2019-09-07 DIAGNOSIS — E7800 Pure hypercholesterolemia, unspecified: Secondary | ICD-10-CM | POA: Diagnosis present

## 2019-09-07 DIAGNOSIS — E785 Hyperlipidemia, unspecified: Secondary | ICD-10-CM | POA: Diagnosis present

## 2019-09-07 DIAGNOSIS — Z803 Family history of malignant neoplasm of breast: Secondary | ICD-10-CM | POA: Diagnosis not present

## 2019-09-07 DIAGNOSIS — W19XXXA Unspecified fall, initial encounter: Secondary | ICD-10-CM | POA: Diagnosis present

## 2019-09-07 DIAGNOSIS — R7989 Other specified abnormal findings of blood chemistry: Secondary | ICD-10-CM

## 2019-09-07 DIAGNOSIS — S0181XA Laceration without foreign body of other part of head, initial encounter: Secondary | ICD-10-CM | POA: Diagnosis present

## 2019-09-07 DIAGNOSIS — E860 Dehydration: Secondary | ICD-10-CM | POA: Diagnosis present

## 2019-09-07 DIAGNOSIS — G300 Alzheimer's disease with early onset: Secondary | ICD-10-CM | POA: Diagnosis present

## 2019-09-07 DIAGNOSIS — E291 Testicular hypofunction: Secondary | ICD-10-CM | POA: Diagnosis present

## 2019-09-07 DIAGNOSIS — R55 Syncope and collapse: Secondary | ICD-10-CM | POA: Diagnosis not present

## 2019-09-07 DIAGNOSIS — F0280 Dementia in other diseases classified elsewhere without behavioral disturbance: Secondary | ICD-10-CM | POA: Diagnosis present

## 2019-09-07 DIAGNOSIS — H9190 Unspecified hearing loss, unspecified ear: Secondary | ICD-10-CM | POA: Diagnosis present

## 2019-09-07 DIAGNOSIS — Z8349 Family history of other endocrine, nutritional and metabolic diseases: Secondary | ICD-10-CM | POA: Diagnosis not present

## 2019-09-07 DIAGNOSIS — Z820 Family history of epilepsy and other diseases of the nervous system: Secondary | ICD-10-CM | POA: Diagnosis not present

## 2019-09-07 DIAGNOSIS — Z20828 Contact with and (suspected) exposure to other viral communicable diseases: Secondary | ICD-10-CM | POA: Diagnosis present

## 2019-09-07 DIAGNOSIS — W19XXXD Unspecified fall, subsequent encounter: Secondary | ICD-10-CM | POA: Diagnosis not present

## 2019-09-07 DIAGNOSIS — Z8052 Family history of malignant neoplasm of bladder: Secondary | ICD-10-CM | POA: Diagnosis not present

## 2019-09-07 DIAGNOSIS — R413 Other amnesia: Secondary | ICD-10-CM | POA: Diagnosis not present

## 2019-09-07 DIAGNOSIS — Z8673 Personal history of transient ischemic attack (TIA), and cerebral infarction without residual deficits: Secondary | ICD-10-CM | POA: Diagnosis not present

## 2019-09-07 DIAGNOSIS — Z8249 Family history of ischemic heart disease and other diseases of the circulatory system: Secondary | ICD-10-CM | POA: Diagnosis not present

## 2019-09-07 DIAGNOSIS — Y92009 Unspecified place in unspecified non-institutional (private) residence as the place of occurrence of the external cause: Secondary | ICD-10-CM | POA: Diagnosis not present

## 2019-09-07 LAB — RAPID URINE DRUG SCREEN, HOSP PERFORMED
Amphetamines: NOT DETECTED
Barbiturates: NOT DETECTED
Benzodiazepines: NOT DETECTED
Cocaine: NOT DETECTED
Opiates: NOT DETECTED
Tetrahydrocannabinol: NOT DETECTED

## 2019-09-07 LAB — BASIC METABOLIC PANEL
Anion gap: 8 (ref 5–15)
BUN: 14 mg/dL (ref 8–23)
CO2: 26 mmol/L (ref 22–32)
Calcium: 9 mg/dL (ref 8.9–10.3)
Chloride: 105 mmol/L (ref 98–111)
Creatinine, Ser: 0.92 mg/dL (ref 0.61–1.24)
GFR calc Af Amer: >60 mL/min (ref >60)
GFR calc non Af Amer: >60 mL/min (ref >60)
Glucose, Bld: 93 mg/dL (ref 70–99)
Potassium: 3.9 mmol/L (ref 3.5–5.1)
Sodium: 139 mmol/L (ref 135–145)

## 2019-09-07 LAB — CBC
HCT: 35.2 % — ABNORMAL LOW (ref 39.0–52.0)
Hemoglobin: 12.8 g/dL — ABNORMAL LOW (ref 13.0–17.0)
MCH: 32.8 pg (ref 26.0–34.0)
MCHC: 36.4 g/dL — ABNORMAL HIGH (ref 30.0–36.0)
MCV: 90.3 fL (ref 80.0–100.0)
Platelets: 160 10*3/uL (ref 150–400)
RBC: 3.9 MIL/uL — ABNORMAL LOW (ref 4.22–5.81)
RDW: 12.1 % (ref 11.5–15.5)
WBC: 6.4 10*3/uL (ref 4.0–10.5)
nRBC: 0 % (ref 0.0–0.2)

## 2019-09-07 LAB — ECHOCARDIOGRAM COMPLETE
Height: 77 in
Weight: 2878.33 oz

## 2019-09-07 LAB — GLUCOSE, CAPILLARY: Glucose-Capillary: 88 mg/dL (ref 70–99)

## 2019-09-07 LAB — HIV ANTIBODY (ROUTINE TESTING W REFLEX): HIV Screen 4th Generation wRfx: NONREACTIVE

## 2019-09-07 MED ORDER — ENOXAPARIN SODIUM 40 MG/0.4ML ~~LOC~~ SOLN
40.0000 mg | Freq: Every day | SUBCUTANEOUS | Status: DC
  Administered 2019-09-07 (×2): 40 mg via SUBCUTANEOUS
  Filled 2019-09-07 (×2): qty 0.4

## 2019-09-07 MED ORDER — DOUBLE ANTIBIOTIC 500-10000 UNIT/GM EX OINT
TOPICAL_OINTMENT | Freq: Every day | CUTANEOUS | Status: DC
  Administered 2019-09-07: 1 via TOPICAL
  Filled 2019-09-07: qty 28.4

## 2019-09-07 NOTE — Progress Notes (Signed)
Carotid artery duplex and bilateral lower extremity venous duplex completed. Refer to "CV Proc" under chart review to view preliminary results.  09/07/2019 10:35 AM Maudry Mayhew, MHA, RVT, RDCS, RDMS

## 2019-09-07 NOTE — Progress Notes (Signed)
Echocardiogram 2D Echocardiogram has been performed.  Oneal Deputy Ashutosh Dieguez 09/07/2019, 9:21 AM

## 2019-09-07 NOTE — Progress Notes (Signed)
Walked pt the halls in the unit, when 69ft from room was zigzagging, wobblely, denies any dizziness. Paged MD regarding that matter and the orthostatics.

## 2019-09-07 NOTE — Consult Note (Signed)
[facility] Nurse wound consult note Reason for Consult: forehead laceration sustained from fall at home Wound type: trauma Pressure Injury POA:  Measurement: 2cm x 5cm (per ED laceration repair note) Wound bed:NA Drainage (amount, consistency, odor) NA Periwound:intact  Dressing procedure/placement/frequency: Antibiotic ointment and dry dressing only.   Re consult if needed, will not follow at this time. Thanks  Garvis Downum [employer], RN,CWOCN, CNS, [place] [phone])

## 2019-09-07 NOTE — Plan of Care (Signed)
  Problem: Education: Goal: Knowledge of General Education information will improve Description: Including pain rating scale, medication(s)/side effects and non-pharmacologic comfort measures Outcome: Progressing   Problem: Clinical Measurements: Goal: Will remain free from infection Outcome: Progressing   Problem: Activity: Goal: Risk for activity intolerance will decrease Outcome: Progressing   Problem: Nutrition: Goal: Adequate nutrition will be maintained Outcome: Progressing   Problem: Elimination: Goal: Will not experience complications related to urinary retention Outcome: Progressing   Problem: Pain Managment: Goal: General experience of comfort will improve Outcome: Progressing   Problem: Safety: Goal: Ability to remain free from injury will improve Outcome: Progressing

## 2019-09-07 NOTE — Progress Notes (Signed)
PROGRESS NOTE  Frederick Cook [id number] DOB: Jul 03, 1956 DOA: 09/06/2019 PCP: Haywood Pao, MD  HPI/Recap of past 24 hours: HPI from Dr [name] is a 63 y.o. male with medical history significant of HLD, memory loss, hypogonadism, who presents with syncope. Pt states that he passed out for less than 1 min when he was standing in front of sink after taking a shower. He fell and injured his forehead, caused laceration to his forehead. Patient denies any prodromal symptoms.  No unilateral weakness, numbness or tingling of his  Extremities, no facial droop, slurred speech, vision loss or new hearing change. No seizure activity. Patient states that he has history of orthostatic status. He states that had a negative MRI of brain last week in [facility] (I could not find the report). In the ED, pt was found to have negative troponin x2, negative COVID-19 test, d-dimer 1.10, Chest x-ray negative.  CT angiogram is negative for PE. CT head showed frontal and parietal lobe atrophy. Minimal periventricular small vessel disease. 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 No acute infarct evident. No mass or hemorrhage. Patient is placed on telemetry bed for observation.   Today, patient reports still feeling wobbly/fatigue, noted to still be orthostatic.  Was unable to ambulate effectively/in a straight line for about 50 feet.  Denies any chest pain, shortness of breath, fever/chills, abdominal pain, nausea/vomiting.  Assessment/Plan: Principal Problem:   Syncope Active Problems:   High cholesterol   Memory loss   Fall  Syncope likely orthostatic hypotension Unclear etiology, likely orthostatic hypotension Vs vasovagal, arrhythmia Noted to still be orthostatic while on IV fluids, continue daily orthostatic Troponin negative, EKG no acute ST changes UDS negative Elevated d-dimer, CTA chest negative for PE, lower extremity venous Doppler negative  for DVT Bilateral carotid Doppler unremarkable Echo showed EF of 60 to 65%, otherwise unremarkable Chest x-ray unremarkable CT head showed frontal and parietal lobe atrophy. Minimal periventricular small vessel disease. 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 No acute infarct evident. No mass or hemorrhage Patient refusing MRI as he just had a recent one done in [facility] (work-up for early onset Alzheimer's) PT/OT  Fall Likely mechanical due to above Sustained forehead laceration, status post repair by ED  Early onset Alzheimer's dementia Follow-up at [facility] outpatient Multiple work-up done recently including an MRI about a week ago         Malnutrition Type:      Malnutrition Characteristics:      Nutrition Interventions:       Estimated body mass index is 21.33 kg/m as calculated from the following:   Height as of this encounter: 6\' 5"  (1.956 m).   Weight as of this encounter: 81.6 kg.     Code Status: Full  Family Communication: None at bedside  Disposition Plan: To be determined, likely home   Consultants:  None  Procedures:  None  Antimicrobials:  None  DVT prophylaxis: Lovenox   Objective: Vitals:   09/07/19 1103 09/07/19 1253 09/07/19 1255 09/07/19 1257  BP: 117/71 114/70 117/64 94/71  Pulse: (!) 56 65 63   Resp: 13 18 17 18   Temp: 98.2 F (36.8 C)     TempSrc: Oral     SpO2: 100% 100% 100% 100%  Weight:      Height:        Intake/Output Summary (Last 24 hours) at 09/07/2019 1729 Last data  filed at 09/07/2019 0800 Gross per 24 hour  Intake 1079.08 ml  Output 550 ml  Net 529.08 ml   Filed Weights   09/06/19 1424 09/06/19 2154 09/07/19 0331  Weight: 82.6 kg 81.3 kg 81.6 kg    Exam:  General: NAD   Cardiovascular: S1, S2 present  Respiratory: CTAB  Abdomen: Soft, nontender, nondistended, bowel sounds present  Musculoskeletal: No bilateral pedal edema noted  Skin:  Normal  Psychiatry: Normal mood   Data Reviewed: CBC: Recent Labs  Lab 09/06/19 1447 09/07/19 0218  WBC 10.3 6.4  HGB 13.8 12.8*  HCT 40.5 35.2*  MCV 92.9 90.3  PLT 180 0000000   Basic Metabolic Panel: Recent Labs  Lab 09/06/19 1447 09/07/19 0218  NA 136 139  K 4.5 3.9  CL 100 105  CO2 26 26  GLUCOSE 108* 93  BUN 18 14  CREATININE 0.89 0.92  CALCIUM 9.7 9.0   GFR: Estimated Creatinine Clearance: 92.4 mL/min (by C-G formula based on SCr of 0.92 mg/dL). Liver Function Tests: No results for input(s): AST, ALT, ALKPHOS, BILITOT, PROT, ALBUMIN in the last 168 hours. No results for input(s): LIPASE, AMYLASE in the last 168 hours. No results for input(s): AMMONIA in the last 168 hours. Coagulation Profile: No results for input(s): INR, PROTIME in the last 168 hours. Cardiac Enzymes: No results for input(s): CKTOTAL, CKMB, CKMBINDEX, TROPONINI in the last 168 hours. BNP (last 3 results) No results for input(s): PROBNP in the last 8760 hours. HbA1C: No results for input(s): HGBA1C in the last 72 hours. CBG: Recent Labs  Lab 09/06/19 1451 09/07/19 0621  GLUCAP 94 88   Lipid Profile: No results for input(s): CHOL, HDL, LDLCALC, TRIG, CHOLHDL, LDLDIRECT in the last 72 hours. Thyroid Function Tests: No results for input(s): TSH, T4TOTAL, FREET4, T3FREE, THYROIDAB in the last 72 hours. Anemia Panel: No results for input(s): VITAMINB12, FOLATE, FERRITIN, TIBC, IRON, RETICCTPCT in the last 72 hours. Urine analysis:    Component Value Date/Time   COLORURINE YELLOW 09/06/2019 [place] 09/06/2019 1447   LABSPEC 1.020 09/06/2019 1447   PHURINE 6.0 09/06/2019 1447   GLUCOSEU NEGATIVE 09/06/2019 1447   HGBUR NEGATIVE 09/06/2019 [place] 09/06/2019 1447   KETONESUR NEGATIVE 09/06/2019 1447   PROTEINUR NEGATIVE 09/06/2019 1447   NITRITE NEGATIVE 09/06/2019 1447   LEUKOCYTESUR NEGATIVE 09/06/2019 1447   Sepsis  Labs: @LABRCNTIP (procalcitonin:4,lacticidven:4)  ) Recent Results (from the past 240 hour(s))  SARS Coronavirus 2 [facility] order, Performed in [facility] hospital lab) Nasopharyngeal Nasopharyngeal Swab     Status: None   Collection Time: 09/06/19  3:49 PM   Specimen: Nasopharyngeal Swab  Result Value Ref Range Status   SARS Coronavirus 2 NEGATIVE NEGATIVE Final    Comment: (NOTE) If result is NEGATIVE SARS-CoV-2 target nucleic acids are NOT DETECTED. The SARS-CoV-2 RNA is generally detectable in upper and lower  respiratory specimens during the acute phase of infection. The lowest  concentration of SARS-CoV-2 viral copies this assay can detect is 250  copies / mL. A negative result does not preclude SARS-CoV-2 infection  and should not be used as the sole basis for treatment or other  patient management decisions.  A negative result may occur with  improper specimen collection / handling, submission of specimen other  than nasopharyngeal swab, presence of viral mutation(s) within the  areas targeted by this assay, and inadequate number of viral copies  (<250 copies / mL). A negative result must be combined with clinical  observations, patient history, and epidemiological information. If result is POSITIVE SARS-CoV-2 target nucleic acids are DETECTED. The SARS-CoV-2 RNA is generally detectable in upper and lower  respiratory specimens dur ing the acute phase of infection.  Positive  results are indicative of active infection with SARS-CoV-2.  Clinical  correlation with patient history and other diagnostic information is  necessary to determine patient infection status.  Positive results do  not rule out bacterial infection or co-infection with other viruses. If result is PRESUMPTIVE POSTIVE SARS-CoV-2 nucleic acids MAY BE PRESENT.   A presumptive positive result was obtained on the submitted specimen  and confirmed on repeat testing.  While 2019 novel coronavirus  (SARS-CoV-2)  nucleic acids may be present in the submitted sample  additional confirmatory testing may be necessary for epidemiological  and / or clinical management purposes  to differentiate between  SARS-CoV-2 and other Sarbecovirus currently known to infect humans.  If clinically indicated additional testing with an alternate test  methodology [phone]) is advised. The SARS-CoV-2 RNA is generally  detectable in upper and lower respiratory sp ecimens during the acute  phase of infection. The expected result is Negative. Fact Sheet for Patients:  StrictlyIdeas.no Fact Sheet for Healthcare Providers: BankingDealers.co.za This test is not yet approved or cleared by the [place] FDA and has been authorized for detection and/or diagnosis of SARS-CoV-2 by FDA under an Emergency Use Authorization (EUA).  This EUA will remain in effect (meaning this test can be used) for the duration of the COVID-19 declaration under Section 564(b)(1) of the Act, 21 U.S.C. section 360bbb-3(b)(1), unless the authorization is terminated or revoked sooner. Performed at [facility], [address] [place], [place] [address]   MRSA PCR Screening     Status: None   Collection Time: 09/06/19  9:54 PM   Specimen: Nasal Mucosa; Nasopharyngeal  Result Value Ref Range Status   MRSA by PCR NEGATIVE NEGATIVE Final    Comment:        The GeneXpert MRSA Assay (FDA approved for NASAL specimens only), is one component of a comprehensive MRSA colonization surveillance program. It is not intended to diagnose MRSA infection nor to guide or monitor treatment for MRSA infections. Performed at [facility], [place] [address], [place], [place] [address]       Studies: Vas US Carotid  Result Date: 09/07/2019 Carotid Arterial Duplex Study Indications:       Syncope. Risk Factors:      Hyperlipidemia. Comparison Study:  No prior study. Performing Technologist:  Maudry Mayhew MHA, RDMS, RVT, RDCS  Examination Guidelines: A complete evaluation includes B-mode imaging, spectral Doppler, color Doppler, and power Doppler as needed of all accessible portions of each vessel. Bilateral testing is considered an integral part of a complete examination. Limited examinations for reoccurring indications may be performed as noted.  Right Carotid Findings: +----------+--------+--------+--------+------------------+------------------+             PSV cm/s EDV cm/s Stenosis Plaque Description Comments            +----------+--------+--------+--------+------------------+------------------+  CCA Prox   101      22                                                       +----------+--------+--------+--------+------------------+------------------+  CCA Distal 86  22                                   intimal thickening  +----------+--------+--------+--------+------------------+------------------+  ICA Prox   75       17                                                       +----------+--------+--------+--------+------------------+------------------+  ICA Distal 62       22                                                       +----------+--------+--------+--------+------------------+------------------+  ECA        103      12                                                       +----------+--------+--------+--------+------------------+------------------+ +----------+--------+-------+----------------+-------------------+             PSV cm/s EDV cms Describe         Arm Pressure (mmHG)  +----------+--------+-------+----------------+-------------------+  Subclavian 165              Multiphasic, WNL                      +----------+--------+-------+----------------+-------------------+ +---------+--------+--+--------+--+---------+  Vertebral PSV cm/s 56 EDV cm/s 20 Antegrade  +---------+--------+--+--------+--+---------+  Left Carotid Findings:  +----------+--------+--------+--------+-----------------------+--------+             PSV cm/s EDV cm/s Stenosis Plaque Description      Comments  +----------+--------+--------+--------+-----------------------+--------+  CCA Prox   88       26                                                  +----------+--------+--------+--------+-----------------------+--------+  CCA Distal 70       27                smooth and heterogenous           +----------+--------+--------+--------+-----------------------+--------+  ICA Prox   86       32                                                  +----------+--------+--------+--------+-----------------------+--------+  ICA Distal 90       34                                                  +----------+--------+--------+--------+-----------------------+--------+  ECA        105  14                                                  +----------+--------+--------+--------+-----------------------+--------+ +----------+--------+--------+----------------+-------------------+             PSV cm/s EDV cm/s Describe         Arm Pressure (mmHG)  +----------+--------+--------+----------------+-------------------+  Subclavian 125               Multiphasic, WNL                      +----------+--------+--------+----------------+-------------------+ +---------+--------+--+--------+--+---------+  Vertebral PSV cm/s 41 EDV cm/s 11 Antegrade  +---------+--------+--+--------+--+---------+  Summary: Right Carotid: Velocities in the right ICA are consistent with a 1-39% stenosis. Left Carotid: Velocities in the left ICA are consistent with a 1-39% stenosis. Vertebrals:  Bilateral vertebral arteries demonstrate antegrade flow. Subclavians: Normal flow hemodynamics were seen in bilateral subclavian              arteries. *See table(s) above for measurements and observations.  Electronically signed by Antony Contras MD on 09/07/2019 at 12:45:17 PM.    Final    Vas [place] Lower Extremity Venous (dvt)  Result  Date: 09/07/2019  Lower Venous Study Indications: Elevated d-dimer 1.10. Other Indications: Recent fall with possible syncope/drop attack. Comparison Study: No prior study. Performing Technologist: Maudry Mayhew MHA, RDMS, RVT, RDCS  Examination Guidelines: A complete evaluation includes B-mode imaging, spectral Doppler, color Doppler, and power Doppler as needed of all accessible portions of each vessel. Bilateral testing is considered an integral part of a complete examination. Limited examinations for reoccurring indications may be performed as noted.  +---------+---------------+---------+-----------+----------+--------------+  RIGHT     Compressibility Phasicity Spontaneity Properties Thrombus Aging  +---------+---------------+---------+-----------+----------+--------------+  CFV       Full            No        Yes                                    +---------+---------------+---------+-----------+----------+--------------+  SFJ       Full                                                             +---------+---------------+---------+-----------+----------+--------------+  FV Prox   Full                                                             +---------+---------------+---------+-----------+----------+--------------+  FV Mid    Full                                                             +---------+---------------+---------+-----------+----------+--------------+  FV Distal Full                                                             +---------+---------------+---------+-----------+----------+--------------+  PFV       Full                                                             +---------+---------------+---------+-----------+----------+--------------+  POP       Full            No        Yes                                    +---------+---------------+---------+-----------+----------+--------------+  PTV       Full                                                              +---------+---------------+---------+-----------+----------+--------------+  PERO      Full                                                             +---------+---------------+---------+-----------+----------+--------------+   +---------+---------------+---------+-----------+----------+--------------+  LEFT      Compressibility Phasicity Spontaneity Properties Thrombus Aging  +---------+---------------+---------+-----------+----------+--------------+  CFV       Full            No        Yes                                    +---------+---------------+---------+-----------+----------+--------------+  SFJ       Full                                                             +---------+---------------+---------+-----------+----------+--------------+  FV Prox   Full                                                             +---------+---------------+---------+-----------+----------+--------------+  FV Mid    Full                                                             +---------+---------------+---------+-----------+----------+--------------+  FV Distal Full                                                             +---------+---------------+---------+-----------+----------+--------------+  PFV       Full                                                             +---------+---------------+---------+-----------+----------+--------------+  POP       Full            No        Yes                                    +---------+---------------+---------+-----------+----------+--------------+  PTV       Full                                                             +---------+---------------+---------+-----------+----------+--------------+  PERO      Full                                                             +---------+---------------+---------+-----------+----------+--------------+  Summary: Right: There is no evidence of deep vein thrombosis in the lower extremity. No cystic structure found in the  popliteal fossa. Left: There is no evidence of deep vein thrombosis in the lower extremity. However, portions of this examination were limited- see technologist comments above. No cystic structure found in the popliteal fossa.  Pulsatile lower extremity venous flow is suggestive of possible elevated right heart pressure. *See table(s) above for measurements and observations. Electronically signed by Servando Snare MD on 09/07/2019 at 4:34:36 PM.    Final     Scheduled Meds:  enoxaparin (LOVENOX) injection  40 mg Subcutaneous QHS   polymixin-bacitracin   Topical Daily   sodium chloride flush  3 mL Intravenous Once   sodium chloride flush  3 mL Intravenous Q12H    Continuous Infusions:  sodium chloride 100 mL/hr at 09/07/19 1206     LOS: 0 days     Alma Friendly, MD [facility]  If 7PM-7AM, please contact night-coverage [email or web address] 09/07/2019, 5:29 PM

## 2019-09-07 NOTE — Plan of Care (Signed)

## 2019-09-08 DIAGNOSIS — W19XXXD Unspecified fall, subsequent encounter: Secondary | ICD-10-CM

## 2019-09-08 LAB — CBC WITH DIFFERENTIAL/PLATELET
Abs Immature Granulocytes: 0 10*3/uL (ref 0.00–0.07)
Basophils Absolute: 0.1 10*3/uL (ref 0.0–0.1)
Basophils Relative: 1 %
Eosinophils Absolute: 0.2 10*3/uL (ref 0.0–0.5)
Eosinophils Relative: 3 %
HCT: 34.5 % — ABNORMAL LOW (ref 39.0–52.0)
Hemoglobin: 11.8 g/dL — ABNORMAL LOW (ref 13.0–17.0)
Immature Granulocytes: 0 %
Lymphocytes Relative: 40 %
Lymphs Abs: 2.4 10*3/uL (ref 0.7–4.0)
MCH: 31.6 pg (ref 26.0–34.0)
MCHC: 34.2 g/dL (ref 30.0–36.0)
MCV: 92.2 fL (ref 80.0–100.0)
Monocytes Absolute: 0.3 10*3/uL (ref 0.1–1.0)
Monocytes Relative: 6 %
Neutro Abs: 3 10*3/uL (ref 1.7–7.7)
Neutrophils Relative %: 50 %
Platelets: 149 10*3/uL — ABNORMAL LOW (ref 150–400)
RBC: 3.74 MIL/uL — ABNORMAL LOW (ref 4.22–5.81)
RDW: 12.1 % (ref 11.5–15.5)
WBC: 5.9 10*3/uL (ref 4.0–10.5)
nRBC: 0 % (ref 0.0–0.2)

## 2019-09-08 LAB — BASIC METABOLIC PANEL
Anion gap: 7 (ref 5–15)
BUN: 13 mg/dL (ref 8–23)
CO2: 25 mmol/L (ref 22–32)
Calcium: 8.7 mg/dL — ABNORMAL LOW (ref 8.9–10.3)
Chloride: 107 mmol/L (ref 98–111)
Creatinine, Ser: 0.86 mg/dL (ref 0.61–1.24)
GFR calc Af Amer: >60 mL/min (ref >60)
GFR calc non Af Amer: >60 mL/min (ref >60)
Glucose, Bld: 96 mg/dL (ref 70–99)
Potassium: 3.8 mmol/L (ref 3.5–5.1)
Sodium: 139 mmol/L (ref 135–145)

## 2019-09-08 LAB — GLUCOSE, CAPILLARY: Glucose-Capillary: 87 mg/dL (ref 70–99)

## 2019-09-08 NOTE — Evaluation (Signed)
Physical Therapy Evaluation Patient Details Name: Frederick Cook MRN: [id number] DOB: 1956-07-31 Today's Date: 09/08/2019   History of Present Illness  Frederick Cook is a 63 y.o. male with medical history significant of HLD, memory loss, hypogonadism, who presents with syncope. Pt states that he passed out for less than 1 min when he was standing in front of sink after taking a shower    Clinical Impression  PT eval complete. Pt independent bed mobility and transfers. Supervision provided for ambulation 350 feet without AD. No LOB. No c/o dizziness. All education complete. Pt to d/c home today. No follow up services indicated. PT signing off.    Follow Up Recommendations No PT follow up    Equipment Recommendations  None recommended by PT    Recommendations for Other Services       Precautions / Restrictions Precautions Precautions: Fall Restrictions Weight Bearing Restrictions: (P) No      Mobility  Bed Mobility Overal bed mobility: Independent                Transfers Overall transfer level: Independent Equipment used: None                Ambulation/Gait Ambulation/Gait assistance: Supervision Gait Distance (Feet): 350 Feet Assistive device: None Gait Pattern/deviations: WFL(Within Functional Limits) Gait velocity: WFL Gait velocity interpretation: >2.62 ft/sec, indicative of community ambulatory General Gait Details: minimally guarded due to fear of falling, no LOB, no physical assist, no c/o dizziness  Stairs            Wheelchair Mobility    Modified Rankin (Stroke Patients Only)       Balance Overall balance assessment: No apparent balance deficits (not formally assessed)                                           Pertinent Vitals/Pain Pain Assessment: No/denies pain    Home Living Family/patient expects to be discharged to:: Private residence Living Arrangements: Spouse/significant other Available Help at  Discharge: Family;Available 24 hours/day Type of Home: House Home Access: Stairs to enter Entrance Stairs-Rails: (P) Left Entrance Stairs-Number of Steps: 2 Home Layout: One level Home Equipment: Grab bars - tub/shower;Hand held shower head      Prior Function Level of Independence: (P) Independent               Hand Dominance   Dominant Hand: (P) Right    Extremity/Trunk Assessment   Upper Extremity Assessment Upper Extremity Assessment: Overall WFL for tasks assessed    Lower Extremity Assessment Lower Extremity Assessment: Overall WFL for tasks assessed    Cervical / Trunk Assessment Cervical / Trunk Assessment: Normal  Communication   Communication: No difficulties  Cognition Arousal/Alertness: Awake/alert Behavior During Therapy: WFL for tasks assessed/performed Overall Cognitive Status: Within Functional Limits for tasks assessed                                        General Comments General comments (skin integrity, edema, etc.): BP stable between positional changes, no orthostasis noted    Exercises     Assessment/Plan    PT Assessment Patent does not need any further PT services  PT Problem List         PT Treatment Interventions  PT Goals (Current goals can be found in the Care Plan section)  Acute Rehab PT Goals Patient Stated Goal: home PT Goal Formulation: All assessment and education complete, DC therapy    Frequency     Barriers to discharge        Co-evaluation PT/OT/SLP Co-Evaluation/Treatment: Yes Reason for Co-Treatment: Necessary to address cognition/behavior during functional activity;To address functional/ADL transfers PT goals addressed during session: Mobility/safety with mobility;Balance         AM-PAC PT "6 Clicks" Mobility  Outcome Measure Help needed turning from your back to your side while in a flat bed without using bedrails?: None Help needed moving from lying on your back to sitting on  the side of a flat bed without using bedrails?: None Help needed moving to and from a bed to a chair (including a wheelchair)?: None Help needed standing up from a chair using your arms (e.g., wheelchair or bedside chair)?: None Help needed to walk in hospital room?: None Help needed climbing 3-5 steps with a railing? : A Little 6 Click Score: 23    End of Session Equipment Utilized During Treatment: Gait belt Activity Tolerance: Patient tolerated treatment well Patient left: in chair;with family/visitor present;with call bell/phone within reach Nurse Communication: Mobility status PT Visit Diagnosis: Unsteadiness on feet (R26.81)    Time: 1135-1204 PT Time Calculation (min) (ACUTE ONLY): 29 min   Charges:   PT Evaluation $PT Eval Low Complexity: 1 Low          Lorrin Goodell, PT  Office # [phone] Pager [phone]   Lorriane Shire 09/08/2019, 12:59 PM

## 2019-09-08 NOTE — Discharge Summary (Signed)
Physician Discharge Summary  Frederick Cook [id number] DOB: [birth year] DOA: 09/06/2019  PCP: Haywood Pao, MD  Admit date: 09/06/2019 Discharge date: 09/08/2019  Admitted From: Home Disposition: Home  Recommendations for Outpatient Follow-up:  1. Follow up with PCP in 1 weeks 2. Please obtain BMP/CBC in one week your next doctors visit.  3. Advised to obtain outpatient MRI result and review with PCP   Discharge Condition: Stable CODE STATUS: Full Diet recommendation: Regular  Brief/Interim Summary: Frederick Cook a 63 y.o.malewith medical history significant ofHLD,memory loss, hypogonadism, who presents with syncope. Pt states that he passed out for less than 1 min when he was standing in front of sink after taking a shower. He fell and injured his forehead, causedlaceration tohisforehead. Patient denies any prodromal symptoms. No unilateral weakness, numbness or tingling of his  Extremities, no facial droop, slurred speech, vision loss or new hearing change. Noseizure activity.  Patient's work-up in the hospital including the CT of the head, echocardiogram and carotid Dopplers was negative.  MRI was not obtained as patient states he recently was involved in some research study and had an MRI outpatient about a week ago.  He did not want another one.  There is no way of requesting these as he was part of the study.  At this time patient is asymptomatic well-hydrated and no longer feeling dizzy. I have spoken with him and his wife to somehow obtained his MRI results and discuss it with his primary care physician.  CT of the head does not show any acute pathology otherwise.  Advised to stay hydrated.  Stable for discharge.   Discharge Diagnoses:  Principal Problem:   Syncope Active Problems:   High cholesterol   Memory loss   Fall  Syncope suspect orthostatic hypotension from dehydration versus vasovagal -Inpatient work-up has been negative including CT of the head,  CTA chest.  Bilateral carotid Doppler is unremarkable.  Echocardiogram showed ejection fraction 60 to 65%. -Unable to get MRI brain as patient is currently refusing.  He recently had this done at [facility] as part of a research study which we do not have access to.  I have advised him to discuss these MRI results with his PCP at the next follow-up in 1 or 2 weeks. -PT/OT prior to discharge  Mild forehead laceration status post repair in ER   Consultations:  None  Subjective: Feels much better back to himself.  No longer feeling dizzy.  Extensively spoke with him and his wife.  All questions answered.  Discharge Exam: Vitals:   09/08/19 0911 09/08/19 0913  BP: 124/74 106/60  Pulse: 64 60  Resp: 12 (!) 21  Temp:    SpO2: 100% 100%   Vitals:   09/08/19 0618 09/08/19 0904 09/08/19 0911 09/08/19 0913  BP:  131/79 124/74 106/60  Pulse:  64 64 60  Resp:  14 12 (!) 21  Temp:  (!) 97.5 F (36.4 C)    TempSrc:  Oral    SpO2:  100% 100% 100%  Weight: 82.8 kg     Height:        General: Pt is alert, awake, not in acute distress Cardiovascular: RRR, S1/S2 +, no rubs, no gallops Respiratory: CTA bilaterally, no wheezing, no rhonchi Abdominal: Soft, NT, ND, bowel sounds + Extremities: no edema, no cyanosis  Discharge Instructions   Allergies as of 09/08/2019   No Known Allergies     Medication List    TAKE these medications   atorvastatin  10 MG tablet Commonly known as: LIPITOR Take 10 mg by mouth daily.   loratadine 10 MG tablet Commonly known as: CLARITIN Take 10 mg by mouth daily.   multivitamin tablet Take 1 tablet by mouth daily.      Follow-up Information    Tisovec, Fransico Him, MD. Schedule an appointment as soon as possible for a visit in 1 week(s).   Specialty: Internal Medicine Contact information: [address] [place] [address] [phone]          No Known Allergies  You were cared for by a hospitalist during your hospital stay. If  you have any questions about your discharge medications or the care you received while you were in the hospital after you are discharged, you can call the unit and asked to speak with the hospitalist on call if the hospitalist that took care of you is not available. Once you are discharged, your primary care physician will handle any further medical issues. Please note that no refills for any discharge medications will be authorized once you are discharged, as it is imperative that you return to your primary care physician (or establish a relationship with a primary care physician if you do not have one) for your aftercare needs so that they can reassess your need for medications and monitor your lab values.   Procedures/Studies: Dg Chest 2 View  Result Date: 09/06/2019 CLINICAL DATA:  63 year old male with syncope. EXAM: CHEST - 2 VIEW COMPARISON:  None. FINDINGS: The heart size and mediastinal contours are within normal limits. Both lungs are clear. The visualized skeletal structures are unremarkable. IMPRESSION: No active cardiopulmonary disease. Electronically Signed   By: Anner Crete M.D.   On: 09/06/2019 15:39   Ct Head Wo Contrast  Result Date: 09/06/2019 CLINICAL DATA:  Syncope with fall EXAM: CT HEAD WITHOUT CONTRAST TECHNIQUE: Contiguous axial images were obtained from the base of the skull through the vertex without intravenous contrast. COMPARISON:  None. FINDINGS: Brain: There is moderate frontal and parietal lobe atrophy bilaterally. The ventricles are normal in size and configuration for age. There is no intracranial mass, hemorrhage, extra-axial fluid collection, or midline shift. There is rather minimal small vessel disease in the centra semiovale bilaterally. There is evidence of a prior small lacunar type infarct in the anterior limb of the right internal capsule. No acute infarct evident. Vascular: There is no hyperdense vessel. There is no appreciable vascular calcification.  Skull: The bony calvarium appears intact. Sinuses/Orbits: Visualized paranasal sinuses are clear. Visualized orbits appear symmetric bilaterally. Other: Visualized mastoid air cells are clear. IMPRESSION: Frontal and parietal lobe atrophy. Minimal periventricular small vessel disease. 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 No acute infarct evident. No mass or hemorrhage. Electronically Signed   By: Lowella Grip III M.D.   On: 09/06/2019 15:41   Ct Angio Chest Pe W/cm &/or Wo Cm  Result Date: 09/06/2019 CLINICAL DATA:  Chest pain EXAM: CT ANGIOGRAPHY CHEST WITH CONTRAST TECHNIQUE: Multidetector CT imaging of the chest was performed using the standard protocol during bolus administration of intravenous contrast. Multiplanar CT image reconstructions and MIPs were obtained to evaluate the vascular anatomy. CONTRAST:  122mL OMNIPAQUE IOHEXOL 350 MG/ML SOLN COMPARISON:  Chest radiograph September 06, 2019 FINDINGS: Cardiovascular: There is no demonstrable pulmonary embolus. There is no thoracic aortic aneurysm or dissection. Visualized great vessels appear normal. There are foci of aortic atherosclerosis. There is no pericardial effusion or pericardial thickening. Mediastinum/Nodes: There is a  5 mm nodular opacity in the inferior aspect of the right lobe of the thyroid, a finding that does not warrant additional surveillance per consensus guidelines. Thyroid otherwise appears normal. There is no demonstrable thoracic adenopathy. No esophageal lesions are evident. Lungs/Pleura: There is no edema or consolidation. There is no appreciable pleural effusion. On axial slice 20 series 6, there is a 4 mm nodular opacity in the apical segment right upper lobe toward the posterior aspect. There is a small calcified granuloma in the posterior segment right upper lobe seen on axial slice 44 series 6. A second tiny calcified granuloma is seen in the right upper lobe anteriorly on axial  slice 60 series 6. Upper Abdomen: There is left adrenal hypertrophy. Right adrenal appears normal. Visualized upper abdominal structures otherwise appear unremarkable. Musculoskeletal: There are no blastic or lytic bone lesions. No evident chest wall lesions. There are foci of degenerative change in the thoracic spine. Review of the MIP images confirms the above findings. IMPRESSION: 1. No demonstrable pulmonary embolus. No thoracic aortic aneurysm or dissection. There are foci of aortic atherosclerosis. 2. No edema or consolidation. Occasional small calcified granulomas. 4 mm noncalcified nodular opacity in the apical segment right upper lobe. No follow-up needed if patient is low-risk. Non-contrast chest CT can be considered in 12 months if patient is high-risk. This recommendation follows the consensus statement: Guidelines for Management of Incidental Pulmonary Nodules Detected on CT Images: From the Fleischner Society 2017; Radiology 2017; 284:228-243. 3.  No evident thoracic adenopathy. 4. Left adrenal hypertrophy. This is a finding of questionable clinical significance. Right adrenal appears normal. Aortic Atherosclerosis (ICD10-I70.0). Electronically Signed   By: Lowella Grip III M.D.   On: 09/06/2019 17:23   Vas US Carotid  Result Date: 09/07/2019 Carotid Arterial Duplex Study Indications:       Syncope. Risk Factors:      Hyperlipidemia. Comparison Study:  No prior study. Performing Technologist: Maudry Mayhew MHA, RDMS, RVT, RDCS  Examination Guidelines: A complete evaluation includes B-mode imaging, spectral Doppler, color Doppler, and power Doppler as needed of all accessible portions of each vessel. Bilateral testing is considered an integral part of a complete examination. Limited examinations for reoccurring indications may be performed as noted.  Right Carotid Findings: +----------+--------+--------+--------+------------------+------------------+           PSV cm/sEDV  cm/sStenosisPlaque DescriptionComments           +----------+--------+--------+--------+------------------+------------------+ CCA Prox  101     22                                                   +----------+--------+--------+--------+------------------+------------------+ CCA Distal86      22                                intimal thickening +----------+--------+--------+--------+------------------+------------------+ ICA Prox  75      17                                                   +----------+--------+--------+--------+------------------+------------------+ ICA Distal62      22                                                   +----------+--------+--------+--------+------------------+------------------+  ECA       103     12                                                   +----------+--------+--------+--------+------------------+------------------+ +----------+--------+-------+----------------+-------------------+           PSV cm/sEDV cmsDescribe        Arm Pressure (mmHG) +----------+--------+-------+----------------+-------------------+ [id number]            Multiphasic, WNL                    +----------+--------+-------+----------------+-------------------+ +---------+--------+--+--------+--+---------+ VertebralPSV cm/s56EDV cm/s20Antegrade +---------+--------+--+--------+--+---------+  Left Carotid Findings: +----------+--------+--------+--------+-----------------------+--------+           PSV cm/sEDV cm/sStenosisPlaque Description     Comments +----------+--------+--------+--------+-----------------------+--------+ CCA Prox  88      26                                              +----------+--------+--------+--------+-----------------------+--------+ CCA Distal70      27              smooth and heterogenous         +----------+--------+--------+--------+-----------------------+--------+ ICA Prox  86      32                                               +----------+--------+--------+--------+-----------------------+--------+ ICA Distal90      34                                              +----------+--------+--------+--------+-----------------------+--------+ ECA       105     14                                              +----------+--------+--------+--------+-----------------------+--------+ +----------+--------+--------+----------------+-------------------+           PSV cm/sEDV cm/sDescribe        Arm Pressure (mmHG) +----------+--------+--------+----------------+-------------------+ [id number]             Multiphasic, WNL                    +----------+--------+--------+----------------+-------------------+ +---------+--------+--+--------+--+---------+ VertebralPSV cm/s41EDV cm/s11Antegrade +---------+--------+--+--------+--+---------+  Summary: Right Carotid: Velocities in the right ICA are consistent with a 1-39% stenosis. Left Carotid: Velocities in the left ICA are consistent with a 1-39% stenosis. Vertebrals:  Bilateral vertebral arteries demonstrate antegrade flow. Subclavians: Normal flow hemodynamics were seen in bilateral subclavian              arteries. *See table(s) above for measurements and observations.  Electronically signed by Antony Contras MD on 09/07/2019 at 12:45:17 PM.    Final    Vas [place] Lower Extremity Venous (dvt)  Result Date: 09/07/2019  Lower Venous Study Indications: Elevated d-dimer 1.10. Other Indications: Recent fall with possible syncope/drop attack. Comparison Study: No prior study. Performing Technologist: Maudry Mayhew MHA, RDMS, RVT,  RDCS  Examination Guidelines: A complete evaluation includes B-mode imaging, spectral Doppler, color Doppler, and power Doppler as needed of all accessible portions of each vessel. Bilateral testing is considered an integral part of a complete examination. Limited examinations for reoccurring indications may  be performed as noted.  +---------+---------------+---------+-----------+----------+--------------+ RIGHT    CompressibilityPhasicitySpontaneityPropertiesThrombus Aging +---------+---------------+---------+-----------+----------+--------------+ CFV      Full           No       Yes                                 +---------+---------------+---------+-----------+----------+--------------+ SFJ      Full                                                        +---------+---------------+---------+-----------+----------+--------------+ FV Prox  Full                                                        +---------+---------------+---------+-----------+----------+--------------+ FV Mid   Full                                                        +---------+---------------+---------+-----------+----------+--------------+ FV DistalFull                                                        +---------+---------------+---------+-----------+----------+--------------+ PFV      Full                                                        +---------+---------------+---------+-----------+----------+--------------+ POP      Full           No       Yes                                 +---------+---------------+---------+-----------+----------+--------------+ PTV      Full                                                        +---------+---------------+---------+-----------+----------+--------------+ PERO     Full                                                        +---------+---------------+---------+-----------+----------+--------------+   +---------+---------------+---------+-----------+----------+--------------+  LEFT     CompressibilityPhasicitySpontaneityPropertiesThrombus Aging +---------+---------------+---------+-----------+----------+--------------+ CFV      Full           No       Yes                                  +---------+---------------+---------+-----------+----------+--------------+ SFJ      Full                                                        +---------+---------------+---------+-----------+----------+--------------+ FV Prox  Full                                                        +---------+---------------+---------+-----------+----------+--------------+ FV Mid   Full                                                        +---------+---------------+---------+-----------+----------+--------------+ FV DistalFull                                                        +---------+---------------+---------+-----------+----------+--------------+ PFV      Full                                                        +---------+---------------+---------+-----------+----------+--------------+ POP      Full           No       Yes                                 +---------+---------------+---------+-----------+----------+--------------+ PTV      Full                                                        +---------+---------------+---------+-----------+----------+--------------+ PERO     Full                                                        +---------+---------------+---------+-----------+----------+--------------+  Summary: Right: There is no evidence of deep vein thrombosis in the lower extremity. No cystic structure found in the popliteal fossa. Left: There is no evidence of deep vein thrombosis in the lower extremity. However, portions of this examination were limited- see technologist  comments above. No cystic structure found in the popliteal fossa.  Pulsatile lower extremity venous flow is suggestive of possible elevated right heart pressure. *See table(s) above for measurements and observations. Electronically signed by Servando Snare MD on 09/07/2019 at 4:34:36 PM.    Final       The results of significant diagnostics from this hospitalization  (including imaging, microbiology, ancillary and laboratory) are listed below for reference.     Microbiology: Recent Results (from the past 240 hour(s))  SARS Coronavirus 2 [facility] order, Performed in [facility] hospital lab) Nasopharyngeal Nasopharyngeal Swab     Status: None   Collection Time: 09/06/19  3:49 PM   Specimen: Nasopharyngeal Swab  Result Value Ref Range Status   SARS Coronavirus 2 NEGATIVE NEGATIVE Final    Comment: (NOTE) If result is NEGATIVE SARS-CoV-2 target nucleic acids are NOT DETECTED. The SARS-CoV-2 RNA is generally detectable in upper and lower  respiratory specimens during the acute phase of infection. The lowest  concentration of SARS-CoV-2 viral copies this assay can detect is 250  copies / mL. A negative result does not preclude SARS-CoV-2 infection  and should not be used as the sole basis for treatment or other  patient management decisions.  A negative result may occur with  improper specimen collection / handling, submission of specimen other  than nasopharyngeal swab, presence of viral mutation(s) within the  areas targeted by this assay, and inadequate number of viral copies  (<250 copies / mL). A negative result must be combined with clinical  observations, patient history, and epidemiological information. If result is POSITIVE SARS-CoV-2 target nucleic acids are DETECTED. The SARS-CoV-2 RNA is generally detectable in upper and lower  respiratory specimens dur ing the acute phase of infection.  Positive  results are indicative of active infection with SARS-CoV-2.  Clinical  correlation with patient history and other diagnostic information is  necessary to determine patient infection status.  Positive results do  not rule out bacterial infection or co-infection with other viruses. If result is PRESUMPTIVE POSTIVE SARS-CoV-2 nucleic acids MAY BE PRESENT.   A presumptive positive result was obtained on the submitted specimen  and confirmed on  repeat testing.  While 2019 novel coronavirus  (SARS-CoV-2) nucleic acids may be present in the submitted sample  additional confirmatory testing may be necessary for epidemiological  and / or clinical management purposes  to differentiate between  SARS-CoV-2 and other Sarbecovirus currently known to infect humans.  If clinically indicated additional testing with an alternate test  methodology [phone]) is advised. The SARS-CoV-2 RNA is generally  detectable in upper and lower respiratory sp ecimens during the acute  phase of infection. The expected result is Negative. Fact Sheet for Patients:  StrictlyIdeas.no Fact Sheet for Healthcare Providers: BankingDealers.co.za This test is not yet approved or cleared by the [place] FDA and has been authorized for detection and/or diagnosis of SARS-CoV-2 by FDA under an Emergency Use Authorization (EUA).  This EUA will remain in effect (meaning this test can be used) for the duration of the COVID-19 declaration under Section 564(b)(1) of the Act, 21 U.S.C. section 360bbb-3(b)(1), unless the authorization is terminated or revoked sooner. Performed at [facility], [address] [place], [place] [address]   MRSA PCR Screening     Status: None   Collection Time: 09/06/19  9:54 PM   Specimen: Nasal Mucosa; Nasopharyngeal  Result Value Ref Range Status   MRSA by PCR NEGATIVE NEGATIVE Final    Comment:  The GeneXpert MRSA Assay (FDA approved for NASAL specimens only), is one component of a comprehensive MRSA colonization surveillance program. It is not intended to diagnose MRSA infection nor to guide or monitor treatment for MRSA infections. Performed at [facility], [place] [address] [place], [place] [address]      Labs: BNP (last 3 results) No results for input(s): BNP in the last 8760 hours. Basic Metabolic Panel: Recent Labs  Lab 09/06/19 1447  09/07/19 0218 09/08/19 0152  NA 136 139 139  K 4.5 3.9 3.8  CL 100 105 107  CO2 26 26 25   GLUCOSE 108* 93 96  BUN 18 14 13   CREATININE 0.89 0.92 0.86  CALCIUM 9.7 9.0 8.7*   Liver Function Tests: No results for input(s): AST, ALT, ALKPHOS, BILITOT, PROT, ALBUMIN in the last 168 hours. No results for input(s): LIPASE, AMYLASE in the last 168 hours. No results for input(s): AMMONIA in the last 168 hours. CBC: Recent Labs  Lab 09/06/19 1447 09/07/19 0218 09/08/19 0152  WBC 10.3 6.4 5.9  NEUTROABS  --   --  3.0  HGB 13.8 12.8* 11.8*  HCT 40.5 35.2* 34.5*  MCV 92.9 90.3 92.2  PLT 180 160 149*   Cardiac Enzymes: No results for input(s): CKTOTAL, CKMB, CKMBINDEX, TROPONINI in the last 168 hours. BNP: Invalid input(s): POCBNP CBG: Recent Labs  Lab 09/06/19 1451 09/07/19 0621 09/08/19 0616  GLUCAP 94 88 87   D-Dimer Recent Labs    09/06/19 1447  DDIMER 1.10*   Hgb A1c No results for input(s): HGBA1C in the last 72 hours. Lipid Profile No results for input(s): CHOL, HDL, LDLCALC, TRIG, CHOLHDL, LDLDIRECT in the last 72 hours. Thyroid function studies No results for input(s): TSH, T4TOTAL, T3FREE, THYROIDAB in the last 72 hours.  Invalid input(s): FREET3 Anemia work up No results for input(s): VITAMINB12, FOLATE, FERRITIN, TIBC, IRON, RETICCTPCT in the last 72 hours. Urinalysis    Component Value Date/Time   COLORURINE YELLOW 09/06/2019 [place] 09/06/2019 1447   LABSPEC 1.020 09/06/2019 1447   PHURINE 6.0 09/06/2019 1447   GLUCOSEU NEGATIVE 09/06/2019 1447   HGBUR NEGATIVE 09/06/2019 [place] 09/06/2019 1447   KETONESUR NEGATIVE 09/06/2019 1447   PROTEINUR NEGATIVE 09/06/2019 1447   NITRITE NEGATIVE 09/06/2019 1447   LEUKOCYTESUR NEGATIVE 09/06/2019 1447   Sepsis Labs Invalid input(s): PROCALCITONIN,  WBC,  LACTICIDVEN Microbiology Recent Results (from the past 240 hour(s))  SARS Coronavirus 2 [facility] order,  Performed in [facility] hospital lab) Nasopharyngeal Nasopharyngeal Swab     Status: None   Collection Time: 09/06/19  3:49 PM   Specimen: Nasopharyngeal Swab  Result Value Ref Range Status   SARS Coronavirus 2 NEGATIVE NEGATIVE Final    Comment: (NOTE) If result is NEGATIVE SARS-CoV-2 target nucleic acids are NOT DETECTED. The SARS-CoV-2 RNA is generally detectable in upper and lower  respiratory specimens during the acute phase of infection. The lowest  concentration of SARS-CoV-2 viral copies this assay can detect is 250  copies / mL. A negative result does not preclude SARS-CoV-2 infection  and should not be used as the sole basis for treatment or other  patient management decisions.  A negative result may occur with  improper specimen collection / handling, submission of specimen other  than nasopharyngeal swab, presence of viral mutation(s) within the  areas targeted by this assay, and inadequate number of viral copies  (<250 copies / mL). A negative result must be combined with  clinical  observations, patient history, and epidemiological information. If result is POSITIVE SARS-CoV-2 target nucleic acids are DETECTED. The SARS-CoV-2 RNA is generally detectable in upper and lower  respiratory specimens dur ing the acute phase of infection.  Positive  results are indicative of active infection with SARS-CoV-2.  Clinical  correlation with patient history and other diagnostic information is  necessary to determine patient infection status.  Positive results do  not rule out bacterial infection or co-infection with other viruses. If result is PRESUMPTIVE POSTIVE SARS-CoV-2 nucleic acids MAY BE PRESENT.   A presumptive positive result was obtained on the submitted specimen  and confirmed on repeat testing.  While 2019 novel coronavirus  (SARS-CoV-2) nucleic acids may be present in the submitted sample  additional confirmatory testing may be necessary for epidemiological  and / or  clinical management purposes  to differentiate between  SARS-CoV-2 and other Sarbecovirus currently known to infect humans.  If clinically indicated additional testing with an alternate test  methodology [phone]) is advised. The SARS-CoV-2 RNA is generally  detectable in upper and lower respiratory sp ecimens during the acute  phase of infection. The expected result is Negative. Fact Sheet for Patients:  StrictlyIdeas.no Fact Sheet for Healthcare Providers: BankingDealers.co.za This test is not yet approved or cleared by the [place] FDA and has been authorized for detection and/or diagnosis of SARS-CoV-2 by FDA under an Emergency Use Authorization (EUA).  This EUA will remain in effect (meaning this test can be used) for the duration of the COVID-19 declaration under Section 564(b)(1) of the Act, 21 U.S.C. section 360bbb-3(b)(1), unless the authorization is terminated or revoked sooner. Performed at [facility], [address] [place], [place] [address]   MRSA PCR Screening     Status: None   Collection Time: 09/06/19  9:54 PM   Specimen: Nasal Mucosa; Nasopharyngeal  Result Value Ref Range Status   MRSA by PCR NEGATIVE NEGATIVE Final    Comment:        The GeneXpert MRSA Assay (FDA approved for NASAL specimens only), is one component of a comprehensive MRSA colonization surveillance program. It is not intended to diagnose MRSA infection nor to guide or monitor treatment for MRSA infections. Performed at [facility], [place] [address], [place],  [address]      Time coordinating discharge:  I have spent 35 minutes face to face with the patient and on the ward discussing the patients care, assessment, plan and disposition with other care givers. >50% of the time was devoted counseling the patient about the risks and benefits of treatment/Discharge disposition and coordinating care.    SIGNED:   Damita Lack, MD  [facility] 09/08/2019, 11:10 AM   If 7PM-7AM, please contact night-coverage [email or web address]

## 2019-09-08 NOTE — Progress Notes (Signed)
Provided patient with verbal discharge instructions. Paper copy also provided to patient. Wife at bedside during discharge instructions. No further question from either patient or wife. IV removed per orders. Belongings taken out by wife. Pt discharge via wheelchair by RN through [employer] entrance to private vehicle.

## 2019-09-08 NOTE — Evaluation (Signed)
Occupational Therapy Evaluation and Discharge Patient Details Name: Frederick Cook MRN: [id number] DOB: [birth year] Today's Date: 09/08/2019    History of Present Illness Frederick Cook is a 63 y.o. male with medical history significant of HLD, memory loss, hypogonadism, who presents with syncope. Pt states that he passed out for less than 1 min when he was standing in front of sink after taking a shower   Clinical Impression   PTA Pt independent in ADL and mobility, enjoys walking 2 miles daily, former IT/computer guy. Pt today is independent in ADL and mobility. Orthostatic pressures taken, and WFL (no drop noted with positional changes or ambulation). Educated Pt and wife on showering safety with BP, they do not have a built-in-seat and so educated on options for safety - they are electing to purchase one out of pocket. OT education complete. No further OT needs. OT to sign off.     Follow Up Recommendations  No OT follow up;Supervision - Intermittent    Equipment Recommendations  Tub/shower seat(Pt plans on purchasing outside of hospital)    Recommendations for Other Services       Precautions / Restrictions Precautions Precautions: Fall Restrictions Weight Bearing Restrictions: No      Mobility Bed Mobility Overal bed mobility: Independent                Transfers Overall transfer level: Independent Equipment used: None                  Balance Overall balance assessment: No apparent balance deficits (not formally assessed)                                         ADL either performed or assessed with clinical judgement   ADL Overall ADL's : At baseline                                       General ADL Comments: cautious, able to don/doff socks, stand long enough for grooming     Vision Baseline Vision/History: Wears glasses Wears Glasses: At all times Patient Visual Report: No change from baseline Vision  Assessment?: No apparent visual deficits     Perception     Praxis      Pertinent Vitals/Pain Pain Assessment: No/denies pain     Hand Dominance Right   Extremity/Trunk Assessment Upper Extremity Assessment Upper Extremity Assessment: Overall WFL for tasks assessed   Lower Extremity Assessment Lower Extremity Assessment: Overall WFL for tasks assessed   Cervical / Trunk Assessment Cervical / Trunk Assessment: Normal   Communication Communication Communication: No difficulties   Cognition Arousal/Alertness: Awake/alert Behavior During Therapy: WFL for tasks assessed/performed;Anxious Overall Cognitive Status: Within Functional Limits for tasks assessed                                 General Comments: nervous, but WFL   General Comments  BP stable between positional changes, no orthostasis noted    Exercises     Shoulder Instructions      Home Living Family/patient expects to be discharged to:: Private residence Living Arrangements: Spouse/significant other Available Help at Discharge: Family;Available 24 hours/day Type of Home: House Home Access: Stairs to enter [employer] of Steps: 2 Entrance  Stairs-Rails: Left Home Layout: One level     Bathroom Shower/Tub: Occupational psychologist: Standard Bathroom Accessibility: Yes How Accessible: Accessible via walker Home Equipment: Grab bars - tub/shower;Hand held shower head          Prior Functioning/Environment Level of Independence: Independent                 OT Problem List: Impaired balance (sitting and/or standing);Decreased activity tolerance      OT Treatment/Interventions:      OT Goals(Current goals can be found in the care plan section) Acute Rehab OT Goals Patient Stated Goal: get home, stay safe OT Goal Formulation: With patient/family Time For Goal Achievement: 09/22/19 Potential to Achieve Goals: Good  OT Frequency:     Barriers to D/C:             Co-evaluation PT/OT/SLP Co-Evaluation/Treatment: Yes Reason for Co-Treatment: For patient/therapist safety;To address functional/ADL transfers PT goals addressed during session: Mobility/safety with mobility;Balance OT goals addressed during session: ADL's and self-care      AM-PAC OT "6 Clicks" Daily Activity     Outcome Measure Help from another person eating meals?: None Help from another person taking care of personal grooming?: None Help from another person toileting, which includes using toliet, bedpan, or urinal?: None Help from another person bathing (including washing, rinsing, drying)?: A Little Help from another person to put on and taking off regular upper body clothing?: None Help from another person to put on and taking off regular lower body clothing?: None 6 Click Score: 23   End of Session Equipment Utilized During Treatment: Gait belt Nurse Communication: Mobility status  Activity Tolerance: Patient tolerated treatment well Patient left: in chair;with call bell/phone within reach;with family/visitor present  OT Visit Diagnosis: History of falling (Z91.81)                Time: [id number] OT Time Calculation (min): 29 min Charges:  OT General Charges $OT Visit: 1 Visit OT Evaluation $OT Eval Low Complexity: [place] OTR/L Acute Rehabilitation Services Pager: [phone] Office: [place] 09/08/2019, 1:42 PM

## 2019-09-12 DIAGNOSIS — G300 Alzheimer's disease with early onset: Secondary | ICD-10-CM | POA: Diagnosis not present

## 2019-09-12 DIAGNOSIS — F0280 Dementia in other diseases classified elsewhere without behavioral disturbance: Secondary | ICD-10-CM | POA: Diagnosis not present

## 2019-09-15 DIAGNOSIS — Z4802 Encounter for removal of sutures: Secondary | ICD-10-CM | POA: Diagnosis not present

## 2019-09-15 DIAGNOSIS — S0190XS Unspecified open wound of unspecified part of head, sequela: Secondary | ICD-10-CM | POA: Diagnosis not present

## 2019-09-15 DIAGNOSIS — R55 Syncope and collapse: Secondary | ICD-10-CM | POA: Diagnosis not present

## 2019-09-26 DIAGNOSIS — G300 Alzheimer's disease with early onset: Secondary | ICD-10-CM | POA: Diagnosis not present

## 2019-09-26 DIAGNOSIS — F0280 Dementia in other diseases classified elsewhere without behavioral disturbance: Secondary | ICD-10-CM | POA: Diagnosis not present

## 2019-10-03 DIAGNOSIS — F489 Nonpsychotic mental disorder, unspecified: Secondary | ICD-10-CM | POA: Insufficient documentation

## 2019-10-18 DIAGNOSIS — R946 Abnormal results of thyroid function studies: Secondary | ICD-10-CM | POA: Diagnosis not present

## 2019-10-18 DIAGNOSIS — E7800 Pure hypercholesterolemia, unspecified: Secondary | ICD-10-CM | POA: Diagnosis not present

## 2019-10-19 DIAGNOSIS — E559 Vitamin D deficiency, unspecified: Secondary | ICD-10-CM | POA: Diagnosis not present

## 2019-10-19 DIAGNOSIS — G3184 Mild cognitive impairment, so stated: Secondary | ICD-10-CM | POA: Diagnosis not present

## 2019-10-19 DIAGNOSIS — E7800 Pure hypercholesterolemia, unspecified: Secondary | ICD-10-CM | POA: Diagnosis not present

## 2019-10-19 DIAGNOSIS — R946 Abnormal results of thyroid function studies: Secondary | ICD-10-CM | POA: Diagnosis not present

## 2019-10-24 DIAGNOSIS — R82998 Other abnormal findings in urine: Secondary | ICD-10-CM | POA: Diagnosis not present

## 2019-10-24 DIAGNOSIS — Z1212 Encounter for screening for malignant neoplasm of rectum: Secondary | ICD-10-CM | POA: Diagnosis not present

## 2019-11-07 DIAGNOSIS — Z23 Encounter for immunization: Secondary | ICD-10-CM | POA: Diagnosis not present

## 2019-11-21 DIAGNOSIS — F0280 Dementia in other diseases classified elsewhere without behavioral disturbance: Secondary | ICD-10-CM | POA: Diagnosis not present

## 2019-11-21 DIAGNOSIS — G300 Alzheimer's disease with early onset: Secondary | ICD-10-CM | POA: Diagnosis not present

## 2019-11-28 DIAGNOSIS — F0280 Dementia in other diseases classified elsewhere without behavioral disturbance: Secondary | ICD-10-CM | POA: Diagnosis not present

## 2019-11-28 DIAGNOSIS — G300 Alzheimer's disease with early onset: Secondary | ICD-10-CM | POA: Diagnosis not present

## 2019-12-05 DIAGNOSIS — G300 Alzheimer's disease with early onset: Secondary | ICD-10-CM | POA: Diagnosis not present

## 2019-12-05 DIAGNOSIS — F0280 Dementia in other diseases classified elsewhere without behavioral disturbance: Secondary | ICD-10-CM | POA: Diagnosis not present

## 2020-01-15 ENCOUNTER — Ambulatory Visit: Payer: Medicare Other | Admitting: Neurology

## 2020-01-16 DIAGNOSIS — F0280 Dementia in other diseases classified elsewhere without behavioral disturbance: Secondary | ICD-10-CM | POA: Diagnosis not present

## 2020-01-16 DIAGNOSIS — G300 Alzheimer's disease with early onset: Secondary | ICD-10-CM | POA: Diagnosis not present

## 2020-01-31 DIAGNOSIS — L720 Epidermal cyst: Secondary | ICD-10-CM | POA: Diagnosis not present

## 2020-02-29 DIAGNOSIS — C44622 Squamous cell carcinoma of skin of right upper limb, including shoulder: Secondary | ICD-10-CM

## 2020-02-29 HISTORY — DX: Squamous cell carcinoma of skin of right upper limb, including shoulder: C44.622

## 2020-03-20 ENCOUNTER — Ambulatory Visit (INDEPENDENT_AMBULATORY_CARE_PROVIDER_SITE_OTHER): Payer: Medicare Other | Admitting: Dermatology

## 2020-03-20 ENCOUNTER — Other Ambulatory Visit: Payer: Self-pay

## 2020-03-20 DIAGNOSIS — C44622 Squamous cell carcinoma of skin of right upper limb, including shoulder: Secondary | ICD-10-CM

## 2020-03-20 DIAGNOSIS — C4492 Squamous cell carcinoma of skin, unspecified: Secondary | ICD-10-CM

## 2020-03-20 MED ORDER — MUPIROCIN 2 % EX OINT: 1.0000 "application " | TOPICAL_OINTMENT | Freq: Every day | CUTANEOUS | 0 refills | Status: DC

## 2020-03-20 NOTE — Patient Instructions (Signed)
Wound Care Instructions for After Surgery  On the day following your surgery, you should begin doing daily dressing changes until your sutures are removed: Remove the bandage. Cleanse the wound gently with soap and water.  Make sure you then dry the skin surrounding the wound completely or the tape will not stick to the skin. Do not use cotton balls on the wound. After the wound is clean and dry, apply the ointment (either prescription antibiotic prescribed by your doctor or plain Vaseline if nothing was prescribed) gently with a Q-tip. If you are using a bandaid to cover: Apply a bandaid large enough to cover the entire wound. If you do not have a bandaid large enough to cover the wound OR if you are sensitive to bandaid adhesive: Cut a non-stick pad (such as Telfa) to fit the size of the wound.  Cover the wound with the non-stick pad. If the wound is draining, you may want to add a small amount of gauze on top of the non-stick pad for a little added compression to the area. Use tape to seal the area completely.  For the next 1-2 weeks: Be sure to keep the wound moist with ointment 24/7 to ensure best healing. If you are unable to cover the wound with a bandage to hold the ointment in place, you may need to reapply the ointment several times a day. Do not bend over or lift heavy items to reduce the chance of elevated blood pressure to the wound. Do not participate in particularly strenuous activities.  Below is a list of dressing supplies you might need.  Cotton-tipped applicators - Q-tips Gauze pads (2x2 and/or 4x4) - All-Purpose Sponges New and clean tube of petroleum jelly (Vaseline) OR prescription antibiotic ointment if prescribed Either a bandaid large enough to cover the entire wound OR non-stick dressing material (Telfa) and Tape (Paper or Hypafix)  FOR ADULT SURGERY PATIENTS: If you need something for pain relief, you may take 1 extra strength Tylenol (acetaminophen) and 2  ibuprofen (200 mg) together every 4 hours as needed. (Do not take these medications if you are allergic to them or if you know you cannot take them for any other reason). Typically you may only need pain medication for 1-3 days.   Comments on the Post-Operative Period Slight swelling and redness often appear around the wound. This is normal and will disappear within several days following the surgery. The healing wound will drain a brownish-red-yellow discharge during healing. This is a normal phase of wound healing. As the wound begins to heal, the drainage may increase in amount. Again, this drainage is normal. Notify us if the drainage becomes persistently bloody, excessively swollen, or intensely painful or develops a foul odor or red streaks.  The healing wound will also typically be itchy. This is normal. If you have severe or persistent pain, Notify us if the discomfort is severe or persistent. Avoid alcoholic beverages when taking pain medicine.  In Case of Wound Hemorrhage A wound hemorrhage is when the bandage suddenly becomes soaked with bright red blood and flows profusely. If this happens, sit down or lie down with your head elevated. If the wound has a dressing on it, do not remove the dressing. Apply pressure to the existing gauze. If the wound is not covered, use a gauze pad to apply pressure and continue applying the pressure for 20 minutes without peeking. DO NOT COVER THE WOUND WITH A LARGE TOWEL OR WASH CLOTH. Release your hand from the   wound site but do not remove the dressing. If the bleeding has stopped, gently clean around the wound. Leave the dressing in place for 24 hours if possible. This wait time allows the blood vessels to close off so that you do not spark a new round of bleeding by disrupting the newly clotted blood vessels with an immediate dressing change. If the bleeding does not subside, continue to hold pressure for 40 minutes. If bleeding continues, page your  physician, contact an After Hours clinic or go to the Emergency Room.   

## 2020-03-20 NOTE — Progress Notes (Signed)
   Follow-Up Visit   Subjective  Frederick Cook is a 64 y.o. male who presents for the following: Skin Cancer (Bx proven SCC R forearm).   The following portions of the chart were reviewed this encounter and updated as appropriate: Tobacco  Allergies  Meds  Problems  Med Hx  Surg Hx  Fam Hx      Review of Systems: No other skin or systemic complaints.  Objective  Well appearing patient in no apparent distress; mood and affect are within normal limits.  A focused examination was performed including R forearm, face. Relevant physical exam findings are noted in the Assessment and Plan.  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  Skin excision  Lesion length (cm):  1.2 Lesion width (cm):  1.2 Margin per side (cm):  0.5 Total excision diameter (cm):  2.2 Informed consent: discussed and consent obtained   Timeout: patient name, date of birth, surgical site, and procedure verified   Procedure prep:  Patient was prepped and draped in usual sterile fashion Prep type:  Chlorhexidine Anesthesia: the lesion was anesthetized in a standard fashion   Anesthesia comment:  13cc Anesthetic:  1% lidocaine w/ epinephrine 1-100,000 nerve block Instrument used comment:  15c Hemostasis achieved with: pressure and electrodesiccation   Outcome: patient tolerated procedure well with no complications   Additional details:  Tagged at distal portion  Skin repair Complexity:  Intermediate Final length (cm):  7.2 Informed consent: discussed and consent obtained   Timeout: patient name, date of birth, surgical site, and procedure verified   Procedure prep:  Patient was prepped and draped in usual sterile fashion Prep type:  Chlorhexidine Anesthesia: the lesion was anesthetized in a standard fashion   Undermining: edges undermined   Subcutaneous layers (deep stitches):  Suture size:  3-0 Suture type: Vicryl (polyglactin  910)   Fine/surface layer approximation (top stitches):  Suture size:  4-0 Suture type: Prolene (polypropylene)   Hemostasis achieved with: suture, pressure and electrodesiccation Outcome: patient tolerated procedure well with no complications   Post-procedure details: sterile dressing applied and wound care instructions given   Dressing type: bacitracin and pressure dressing    mupirocin ointment (BACTROBAN) 2 %  Specimen 1 - Surgical pathology Differential Diagnosis: r/o Residual SCC Check Margins: Yes Keratotic pink papule/nodule or plaque. Tagged at distal portion  Start mupirocin daily with dressing changes  Return in about 9 days (around 03/29/2020) for as scheduled for suture removal.   Graciella Belton, RMA, am acting as scribe for Forest Gleason, MD .  Documentation: I have reviewed the above documentation for accuracy and completeness, and I agree with the above.  Forest Gleason, MD

## 2020-03-25 ENCOUNTER — Telehealth: Payer: Self-pay

## 2020-03-25 NOTE — Telephone Encounter (Signed)
Left message for patient advising margins free from last weeks procedure for SCC. If any questions, please call otherwise we will see him this week for suture removal.

## 2020-03-25 NOTE — Progress Notes (Signed)
Skin (M), right forearm RESIDUAL SQUAMOUS CELL CARCINOMA, MARGINS FREE  Entire lesion appears to be out. No additional treatment needed at this time. Please call our office [phone] with any questions.   MAs please call

## 2020-03-26 ENCOUNTER — Encounter: Payer: Self-pay | Admitting: Dermatology

## 2020-03-29 ENCOUNTER — Other Ambulatory Visit: Payer: Self-pay

## 2020-03-29 ENCOUNTER — Encounter: Payer: Self-pay | Admitting: Dermatology

## 2020-03-29 ENCOUNTER — Ambulatory Visit: Payer: Medicare Other | Admitting: Dermatology

## 2020-03-29 DIAGNOSIS — Z1283 Encounter for screening for malignant neoplasm of skin: Secondary | ICD-10-CM

## 2020-03-29 DIAGNOSIS — Z85828 Personal history of other malignant neoplasm of skin: Secondary | ICD-10-CM | POA: Diagnosis not present

## 2020-03-29 DIAGNOSIS — L821 Other seborrheic keratosis: Secondary | ICD-10-CM | POA: Diagnosis not present

## 2020-03-29 DIAGNOSIS — D0462 Carcinoma in situ of skin of left upper limb, including shoulder: Secondary | ICD-10-CM

## 2020-03-29 DIAGNOSIS — D2271 Melanocytic nevi of right lower limb, including hip: Secondary | ICD-10-CM | POA: Diagnosis not present

## 2020-03-29 DIAGNOSIS — L578 Other skin changes due to chronic exposure to nonionizing radiation: Secondary | ICD-10-CM

## 2020-03-29 DIAGNOSIS — D1801 Hemangioma of skin and subcutaneous tissue: Secondary | ICD-10-CM

## 2020-03-29 DIAGNOSIS — L814 Other melanin hyperpigmentation: Secondary | ICD-10-CM

## 2020-03-29 DIAGNOSIS — D229 Melanocytic nevi, unspecified: Secondary | ICD-10-CM

## 2020-03-29 DIAGNOSIS — L570 Actinic keratosis: Secondary | ICD-10-CM | POA: Diagnosis not present

## 2020-03-29 DIAGNOSIS — D485 Neoplasm of uncertain behavior of skin: Secondary | ICD-10-CM

## 2020-03-29 NOTE — Patient Instructions (Signed)

## 2020-03-29 NOTE — Progress Notes (Signed)
Follow-Up Visit   Subjective  Frederick Cook is a 64 y.o. male who presents for the following: Annual Exam (patient is here today for an all over exam and to remove his sutures from his SCC surgery site.).  Patient is here for skin cancer screening and Cook check.   The following portions of the chart were reviewed this encounter and updated as appropriate: Tobacco  Allergies  Meds  Problems  Med Hx  Surg Hx  Fam Hx     Review of Systems: No other skin or systemic complaints.  Objective  Well appearing patient in no apparent distress; mood and affect are within normal limits.  A full examination was performed including scalp, head, eyes, ears, nose, lips, neck, chest, axillae, abdomen, back, buttocks, bilateral upper extremities, bilateral lower extremities, hands, feet, fingers, toes, fingernails, and toenails. All findings within normal limits unless otherwise noted below.  Objective  L dorsal hand x 1, L frontal scalp x 1, L temple x 3, L nose x 1, L neck x 1 (7): Erythematous thin papules/macules with gritty scale.   Objective  R forearm: Incision site is clean, dry and intact  Objective  L sup shoulder: 0.5 cm pink and brown papule.       Objective  R med knee: 0.5 cm med to dark brown thin papule   Images      Assessment & Plan  AK (actinic keratosis) (7) L dorsal hand x 1, L frontal scalp x 1, L temple x 3, L nose x 1, L neck x 1  Recommend daily broad spectrum sunscreen SPF 30+ to sun-exposed areas, reapply every 2 hours as needed. Call for new or changing lesions.   Destruction of lesion - L dorsal hand x 1, L frontal scalp x 1, L temple x 3, L nose x 1, L neck x 1 Complexity: simple   Destruction method: cryotherapy   Informed consent: discussed and consent obtained   Timeout:  patient name, date of birth, surgical site, and procedure verified Lesion destroyed using liquid nitrogen: Yes   Region frozen until ice ball extended beyond lesion:  Yes   Outcome: patient tolerated procedure well with no complications   Post-procedure details: wound care instructions given    History of SCC (squamous cell carcinoma) of skin R forearm  Wound cleansed, sutures removed, wound cleansed and steri strips applied. Discussed pathology results.   Neoplasm of uncertain behavior of skin L sup shoulder  Skin / nail biopsy Type of biopsy: tangential   Informed consent: discussed and consent obtained   Timeout: patient name, date of birth, surgical site, and procedure verified   Procedure prep:  Patient was prepped and draped in usual sterile fashion Prep type:  Isopropyl alcohol Anesthesia: the lesion was anesthetized in a standard fashion   Anesthetic:  1% lidocaine w/ epinephrine 1-100,000 buffered w/ 8.4% NaHCO3 Instrument used: flexible razor blade   Hemostasis achieved with: pressure, aluminum chloride and electrodesiccation   Outcome: patient tolerated procedure well   Post-procedure details: sterile dressing applied and wound care instructions given   Dressing type: bandage and petrolatum    Specimen 1 - Surgical pathology Differential Diagnosis: D48.5 r/o ISK vs atypia vs pigmented BCC vs SCC Check Margins: No 0.5 cm pink and brown papule.  Nevus R med knee  Benign-appearing.  Observation.  Call clinic for new or changing moles.  Recommend daily use of broad spectrum spf 30+ sunscreen to sun-exposed areas.     Seborrheic Keratoses -  Stuck-on, waxy, tan-brown papules and plaques  - Discussed benign etiology and prognosis. - Observe - Call for any changes  Actinic Damage - diffuse scaly erythematous macules with underlying dyspigmentation - Recommend daily broad spectrum sunscreen SPF 30+ to sun-exposed areas, reapply every 2 hours as needed.  - Call for new or changing lesions.  Lentigines - Scattered tan macules - Discussed due to sun exposure - Benign, observe - Call for any changes  Hemangiomas - Red  papules - Discussed benign nature - Observe - Call for any changes  Melanocytic Nevi - Tan-brown and/or pink-flesh-colored symmetric macules and papules - Benign appearing on exam today - Observation - Call clinic for new or changing moles - Recommend daily use of broad spectrum spf 30+ sunscreen to sun-exposed areas.    Return in about 6 months (around 09/28/2020).   Luther Redo, CMA, am acting as scribe for Forest Gleason, MD .   Documentation: I have reviewed the above documentation for accuracy and completeness, and I agree with the above.  Forest Gleason, MD

## 2020-04-02 NOTE — Progress Notes (Signed)
Skin , left sup shoulder  SQUAMOUS CELL CARCINOMA IN SITU ARISING IN A SEBORRHEIC KERATOSIS, BASE INVOLVED.   This is a very thin squamous cell carcinoma arising in a wisdom spot. Given that it is limited to the top layer of skin, I recommend ED&C in the next 4 weeks.   MAs please call and if patient has any questions, I can call them tomorrow to discuss results and treatment recommendation.  Thank you

## 2020-04-03 ENCOUNTER — Encounter: Payer: Medicare Other | Admitting: Dermatology

## 2020-04-03 ENCOUNTER — Telehealth: Payer: Self-pay

## 2020-04-03 NOTE — Telephone Encounter (Signed)
Patients spouse called asking for BX results.   Called [phone] and left message to return my call.

## 2020-04-04 ENCOUNTER — Encounter: Payer: Self-pay | Admitting: Dermatology

## 2020-04-05 NOTE — Telephone Encounter (Signed)
Pts spouse has been advised of BX results and scheduled for [facility]

## 2020-04-09 ENCOUNTER — Telehealth: Payer: Self-pay

## 2020-04-09 NOTE — Telephone Encounter (Signed)
error 

## 2020-04-10 ENCOUNTER — Other Ambulatory Visit: Payer: Self-pay

## 2020-04-10 ENCOUNTER — Encounter: Payer: Self-pay | Admitting: Dermatology

## 2020-04-10 ENCOUNTER — Ambulatory Visit: Payer: Medicare Other | Admitting: Dermatology

## 2020-04-10 DIAGNOSIS — Z872 Personal history of diseases of the skin and subcutaneous tissue: Secondary | ICD-10-CM | POA: Diagnosis not present

## 2020-04-10 DIAGNOSIS — C4492 Squamous cell carcinoma of skin, unspecified: Secondary | ICD-10-CM

## 2020-04-10 DIAGNOSIS — D0462 Carcinoma in situ of skin of left upper limb, including shoulder: Secondary | ICD-10-CM

## 2020-04-10 DIAGNOSIS — D099 Carcinoma in situ, unspecified: Secondary | ICD-10-CM

## 2020-04-10 NOTE — Progress Notes (Signed)
   Follow-Up Visit   Subjective  Frederick Cook is a 64 y.o. male who presents for the following: Skin Cancer (SCCis Left shoulder, BX proven).  SCCis arising in a SK, base involved L. Sup shoulder BX on 03/29/20  The following portions of the chart were reviewed this encounter and updated as appropriate: Tobacco  Allergies  Meds  Problems  Med Hx  Surg Hx  Fam Hx      Review of Systems: No other skin or systemic complaints.  Objective  Well appearing patient in no apparent distress; mood and affect are within normal limits.  A focused examination was performed including Left sup shoulder and left dorsal hand. Relevant physical exam findings are noted in the Assessment and Plan.  Objective  Left Sup Shoulder: Keratotic pink papule/nodule or plaque.   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 Anesthesia: the lesion was anesthetized in a standard fashion   Anesthetic:  1% lidocaine w/ epinephrine 1-100,000 buffered w/ 8.4% NaHCO3 Curettage performed in three different directions: Yes   Electrodesiccation performed over the curetted area: Yes   Lesion length (cm):  0.5 Lesion width (cm):  0.5 Margin per side (cm):  0.3 Final wound size (cm):  1.1 Hemostasis achieved with:  electrodesiccation Outcome: patient tolerated procedure well with no complications   Post-procedure details: sterile dressing applied and wound care instructions given   Dressing type: petrolatum    SCCis plan ED&C today  History of actinic keratoses Left  Dorsal Hand  AK s/p Ln2. Recommend re-evaluation in 2-3 more weeks. Apply Vaseline and recheck. Call if not clear or if worsening.   Return for as scheduled.   IDonzetta Kohut, CMA, am acting as scribe for Forest Gleason, MD .  Documentation: I have reviewed the above documentation for accuracy and completeness, and I agree with the above.  Forest Gleason, MD

## 2020-04-10 NOTE — Patient Instructions (Addendum)
Electrodesiccation and Curettage ("Scrape and Burn") Wound Care Instructions  1. Leave the original bandage on for 24 hours if possible.  If the bandage becomes soaked or soiled before that time, it is OK to remove it and examine the wound.  A small amount of post-operative bleeding is normal.  If excessive bleeding occurs, remove the bandage, place gauze over the site and apply continuous pressure (no peeking) over the area for 30 minutes. If this does not work, please call our clinic as soon as possible or page your doctor if it is after hours.   2. Once a day, cleanse the wound with soap and water. It is fine to shower. If a thick crust develops you may use a Q-tip dipped into dilute hydrogen peroxide (mix 1:1 with water) to dissolve it.  Hydrogen peroxide can slow the healing process, so use it only as needed.    3. After washing, apply petroleum jelly (Vaseline) or an antibiotic ointment if your doctor prescribed one for you, followed by a bandage.    4. For best healing, the wound should be covered with a layer of ointment at all times. If you are not able to keep the area covered with a bandage to hold the ointment in place, this may mean re-applying the ointment several times a day.  Continue this wound care until the wound has healed and is no longer open. It may take several weeks for the wound to heal and close.  Itching and mild discomfort is normal during the healing process.  If you have any discomfort, you can take Tylenol (acetaminophen) or ibuprofen as directed on the bottle. (Please do not take these if you have an allergy to them or cannot take them for another reason).  Some redness, tenderness and white or yellow material in the wound is normal healing.  If the area becomes very sore and red, or develops a thick yellow-green material (pus), it may be infected; please notify us.    Wound healing continues for up to one year following surgery. It is not unusual to experience pain  in the scar from time to time during the interval.  If the pain becomes severe or the scar thickens, you should notify the office.    A slight amount of redness in a scar is expected for the first six months.  After six months, the redness will fade and the scar will soften and fade.  The color difference becomes less noticeable with time.  If there are any problems, return for a post-op surgery check at your earliest convenience.  To improve the appearance of the scar, you can use silicone scar gel, cream, or sheets (such as Mederma or Serica) every night for up to one year. These are available over the counter (without a prescription).  Please call our office at [phone] for any questions or concerns.    Recommend daily broad spectrum sunscreen SPF 30+ to sun-exposed areas, reapply every 2 hours as needed. Call for new or changing lesions.

## 2020-04-11 ENCOUNTER — Encounter: Payer: Medicare Other | Admitting: Dermatology

## 2020-09-26 ENCOUNTER — Encounter: Payer: Medicare Other | Admitting: Dermatology

## 2020-10-23 DIAGNOSIS — Z125 Encounter for screening for malignant neoplasm of prostate: Secondary | ICD-10-CM | POA: Diagnosis not present

## 2020-10-23 DIAGNOSIS — E291 Testicular hypofunction: Secondary | ICD-10-CM | POA: Diagnosis not present

## 2020-10-23 DIAGNOSIS — E559 Vitamin D deficiency, unspecified: Secondary | ICD-10-CM | POA: Diagnosis not present

## 2020-10-23 DIAGNOSIS — E7800 Pure hypercholesterolemia, unspecified: Secondary | ICD-10-CM | POA: Diagnosis not present

## 2020-10-23 DIAGNOSIS — Z0000 Encounter for general adult medical examination without abnormal findings: Secondary | ICD-10-CM | POA: Diagnosis not present

## 2020-10-30 DIAGNOSIS — R946 Abnormal results of thyroid function studies: Secondary | ICD-10-CM | POA: Diagnosis not present

## 2020-10-30 DIAGNOSIS — R82998 Other abnormal findings in urine: Secondary | ICD-10-CM | POA: Diagnosis not present

## 2020-10-30 DIAGNOSIS — Z0000 Encounter for general adult medical examination without abnormal findings: Secondary | ICD-10-CM | POA: Diagnosis not present

## 2020-10-30 DIAGNOSIS — E291 Testicular hypofunction: Secondary | ICD-10-CM | POA: Diagnosis not present

## 2020-10-30 DIAGNOSIS — E7800 Pure hypercholesterolemia, unspecified: Secondary | ICD-10-CM | POA: Diagnosis not present

## 2020-12-14 IMAGING — CT CT ANGIO CHEST
2 of 9 series · 18 of 36 positions shown · IV contrast (omnipaque)
Comparison: Chest radiograph September 06, 2019

CLINICAL DATA: Chest pain

EXAM:
CT ANGIOGRAPHY CHEST WITH CONTRAST
TECHNIQUE: Multidetector CT imaging of the chest was performed using the
standard protocol during bolus administration of intravenous
contrast. Multiplanar CT image reconstructions and MIPs were
obtained to evaluate the vascular anatomy.
CONTRAST:  100mL OMNIPAQUE IOHEXOL 350 MG/ML SOLN

[Series 7: pe coronal mpr · coronal · 0.71mm/px · 1 of 134 slices shown]
[im 67/134  mediastinal]
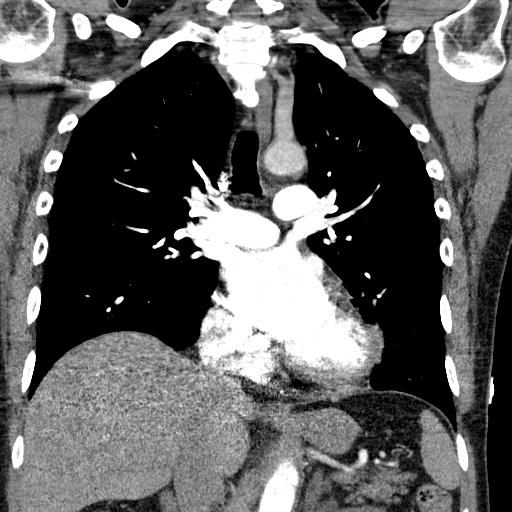

[Series 11: pe thins · axial · 0.69mm/px · z∈[-150,+173]mm · 17 of 361 slices shown]
[im 19/361  lung]
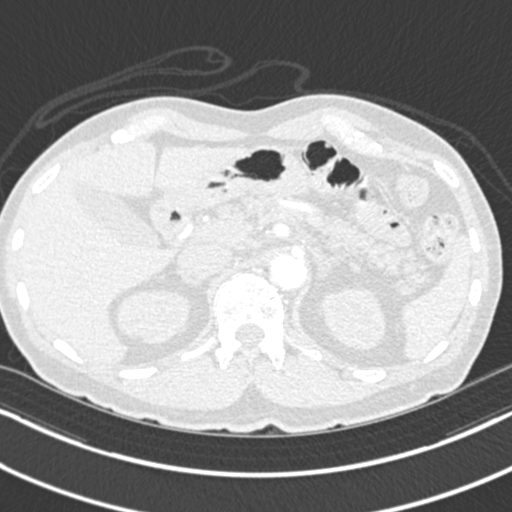
[im 38/361  mediastinal]
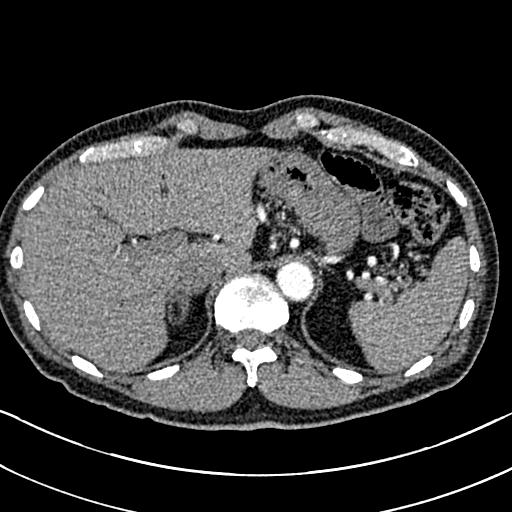
[im 57/361  lung]
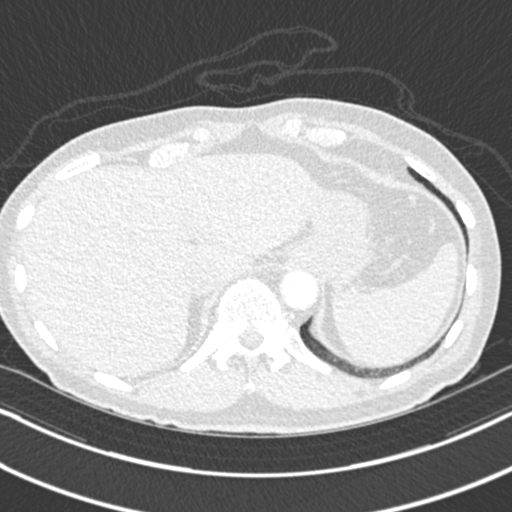
[im 76/361  mediastinal]
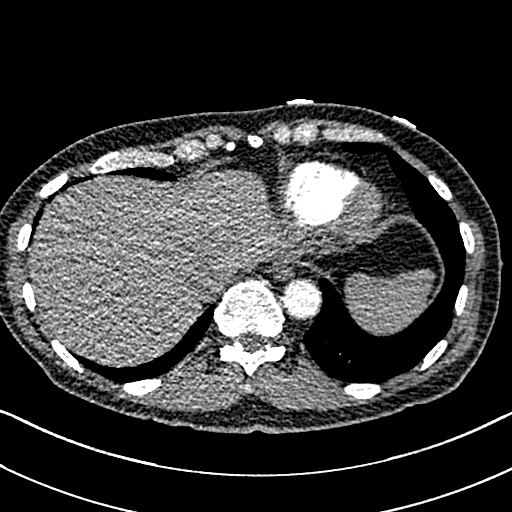
[im 95/361  lung]
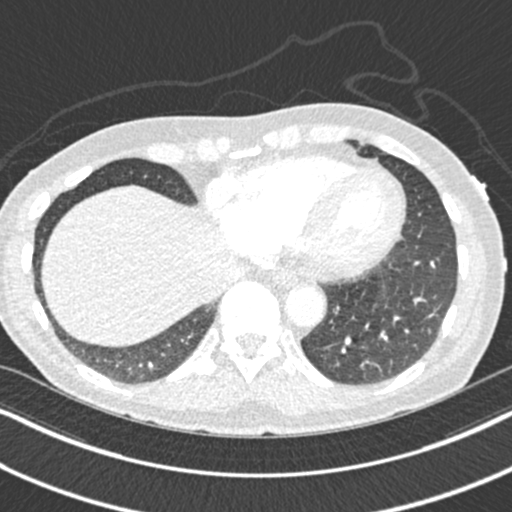
[im 114/361  mediastinal]
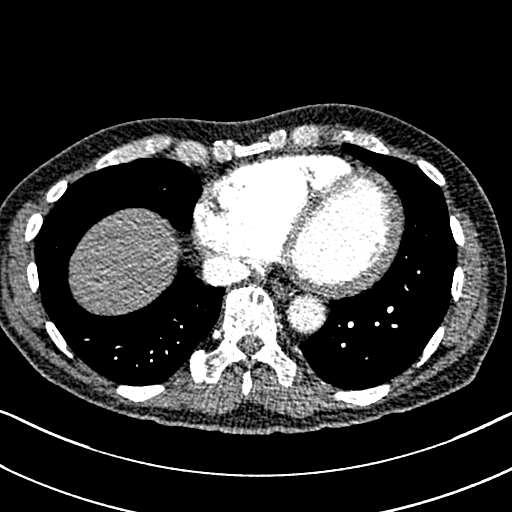
[im 133/361  lung]
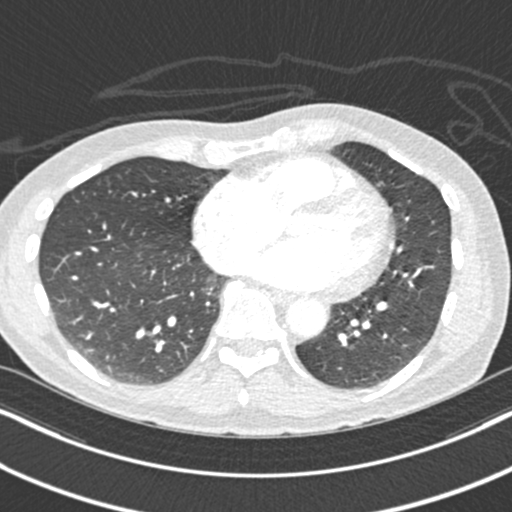
[im 152/361  mediastinal]
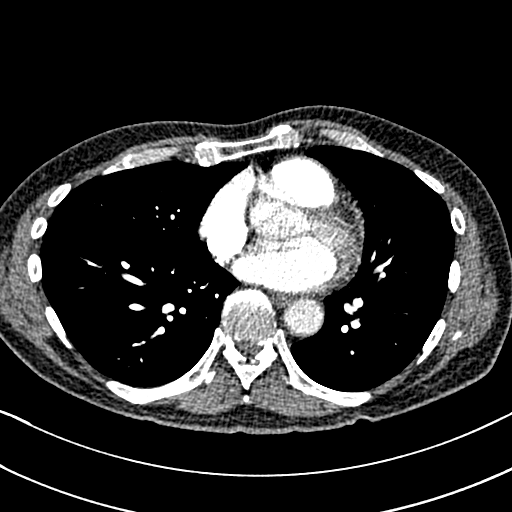
[im 190/361  lung]
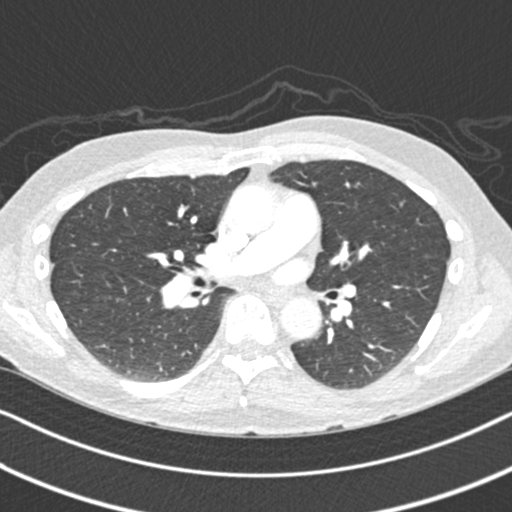
[im 209/361  mediastinal]
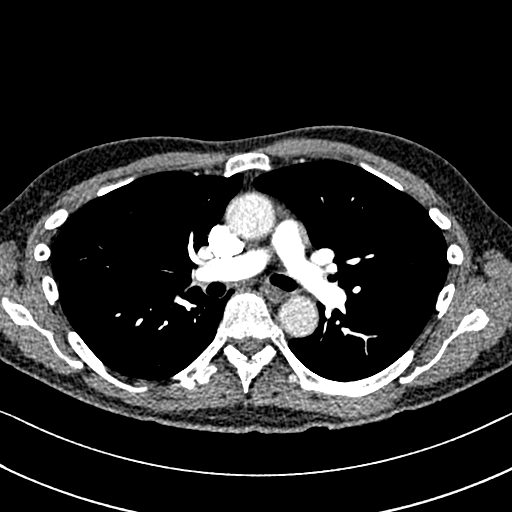
[im 228/361  lung]
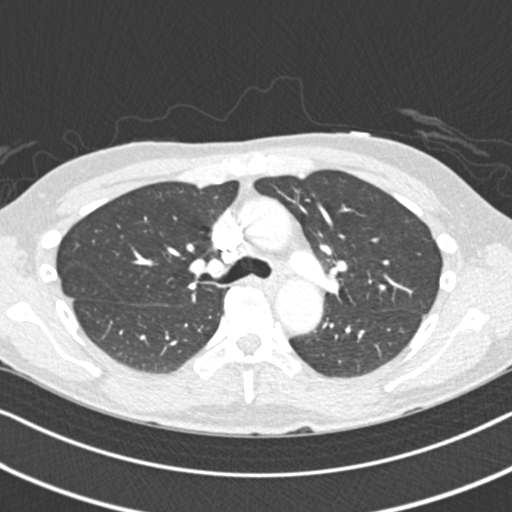
[im 247/361  mediastinal]
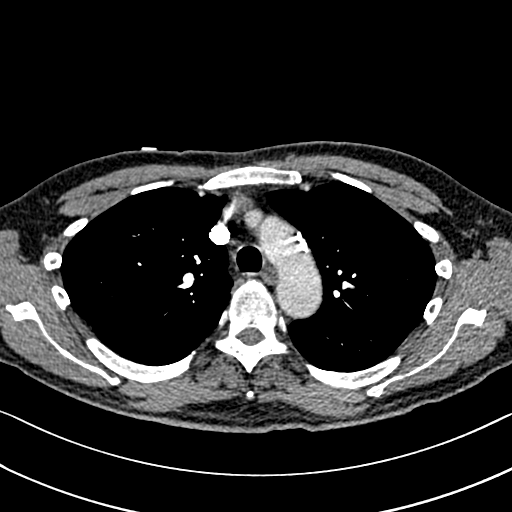
[im 266/361  lung]
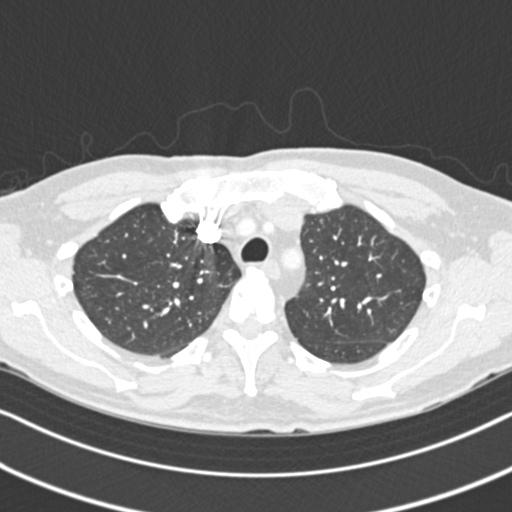
[im 285/361  mediastinal]
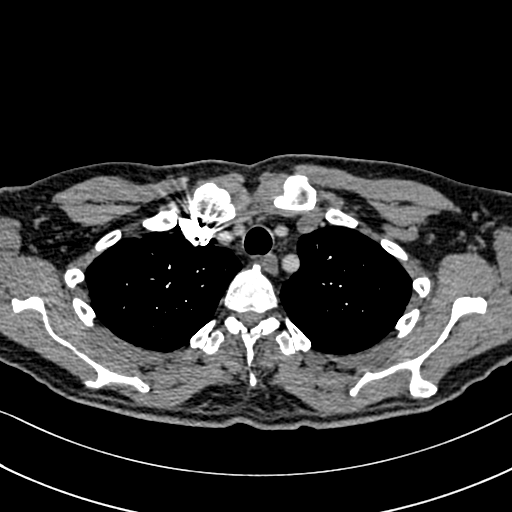
[im 304/361  lung]
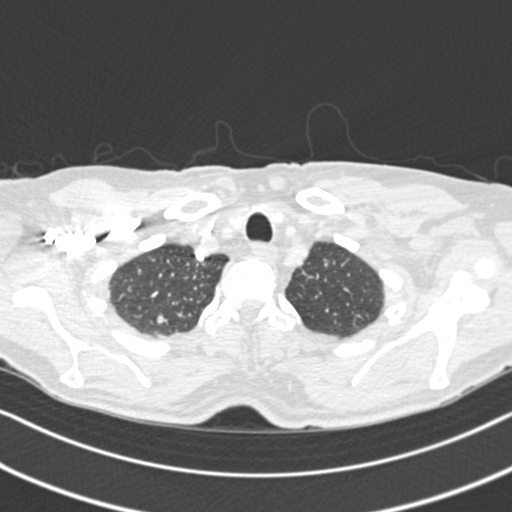
[im 323/361  mediastinal]
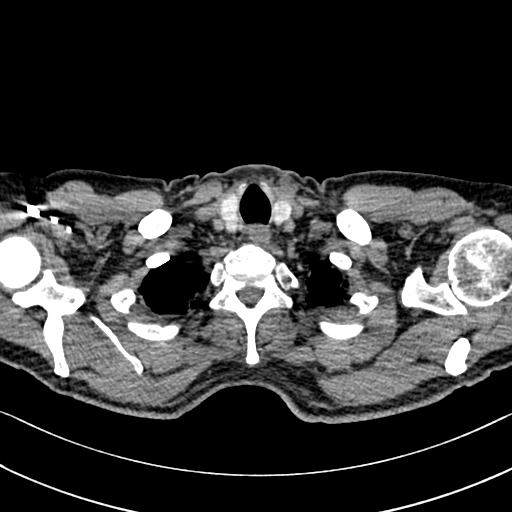
[im 342/361  lung]
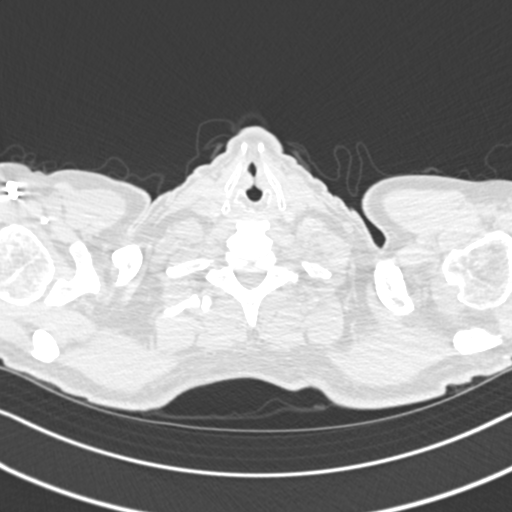

[18 of 36 positions shown; findings below may reference images not displayed]

FINDINGS: Cardiovascular: There is no demonstrable pulmonary embolus. There is
no thoracic aortic aneurysm or dissection. Visualized great vessels
appear normal. There are foci of aortic atherosclerosis. There is no
pericardial effusion or pericardial thickening.

Mediastinum/Nodes: There is a 5 mm nodular opacity in the inferior
aspect of the right lobe of the thyroid, a finding that does not
warrant additional surveillance per consensus guidelines. Thyroid
otherwise appears normal. There is no demonstrable thoracic
adenopathy. No esophageal lesions are evident.

Lungs/Pleura: There is no edema or consolidation. There is no
appreciable pleural effusion. On axial slice 20 series 6, there is a
4 mm nodular opacity in the apical segment right upper lobe toward
the posterior aspect. There is a small calcified granuloma in the
posterior segment right upper lobe seen on axial slice 44 series 6.
A second tiny calcified granuloma is seen in the right upper lobe
anteriorly on axial slice 60 series 6.

Upper Abdomen: There is left adrenal hypertrophy. Right adrenal
appears normal. Visualized upper abdominal structures otherwise
appear unremarkable.

Musculoskeletal: There are no blastic or lytic bone lesions. No
evident chest wall lesions. There are foci of degenerative change in
the thoracic spine.

Review of the MIP images confirms the above findings.
IMPRESSION: 1. No demonstrable pulmonary embolus. No thoracic aortic aneurysm or
dissection. There are foci of aortic atherosclerosis.

2. No edema or consolidation. Occasional small calcified granulomas.
4 mm noncalcified nodular opacity in the apical segment right upper
lobe. No follow-up needed if patient is low-risk. Non-contrast chest
CT can be considered in 12 months if patient is high-risk. This
recommendation follows the consensus statement: Guidelines for
Management of Incidental Pulmonary Nodules Detected on CT Images:

3.  No evident thoracic adenopathy.

4. Left adrenal hypertrophy. This is a finding of questionable
clinical significance. Right adrenal appears normal.

Aortic Atherosclerosis ([id number]).

## 2021-01-01 ENCOUNTER — Other Ambulatory Visit: Payer: Self-pay

## 2021-01-01 ENCOUNTER — Ambulatory Visit (INDEPENDENT_AMBULATORY_CARE_PROVIDER_SITE_OTHER): Payer: Medicare Other | Admitting: Dermatology

## 2021-01-01 DIAGNOSIS — D229 Melanocytic nevi, unspecified: Secondary | ICD-10-CM

## 2021-01-01 DIAGNOSIS — L814 Other melanin hyperpigmentation: Secondary | ICD-10-CM | POA: Diagnosis not present

## 2021-01-01 DIAGNOSIS — Z85828 Personal history of other malignant neoplasm of skin: Secondary | ICD-10-CM | POA: Diagnosis not present

## 2021-01-01 DIAGNOSIS — D489 Neoplasm of uncertain behavior, unspecified: Secondary | ICD-10-CM

## 2021-01-01 DIAGNOSIS — Z1283 Encounter for screening for malignant neoplasm of skin: Secondary | ICD-10-CM | POA: Diagnosis not present

## 2021-01-01 DIAGNOSIS — L570 Actinic keratosis: Secondary | ICD-10-CM | POA: Diagnosis not present

## 2021-01-01 DIAGNOSIS — L578 Other skin changes due to chronic exposure to nonionizing radiation: Secondary | ICD-10-CM

## 2021-01-01 DIAGNOSIS — Z872 Personal history of diseases of the skin and subcutaneous tissue: Secondary | ICD-10-CM

## 2021-01-01 DIAGNOSIS — C44529 Squamous cell carcinoma of skin of other part of trunk: Secondary | ICD-10-CM

## 2021-01-01 DIAGNOSIS — D045 Carcinoma in situ of skin of trunk: Secondary | ICD-10-CM

## 2021-01-01 DIAGNOSIS — L821 Other seborrheic keratosis: Secondary | ICD-10-CM

## 2021-01-01 DIAGNOSIS — D1800 Hemangioma unspecified site: Secondary | ICD-10-CM

## 2021-01-01 HISTORY — DX: Squamous cell carcinoma of skin of other part of trunk: C44.529

## 2021-01-01 NOTE — Patient Instructions (Addendum)
Melanoma ABCDEs  Melanoma is the most dangerous type of skin cancer, and is the leading cause of death from skin disease.  You are more likely to develop melanoma if you:  Have light-colored skin, light-colored eyes, or red or blond hair  Spend a lot of time in the sun  Tan regularly, either outdoors or in a tanning bed  Have had blistering sunburns, especially during childhood  Have a close family member who has had a melanoma  Have atypical moles or large birthmarks  Early detection of melanoma is key since treatment is typically straightforward and cure rates are extremely high if we catch it early.   The first sign of melanoma is often a change in a mole or a new dark spot.  The ABCDE system is a way of remembering the signs of melanoma.  A for asymmetry:  The two halves do not match. B for border:  The edges of the growth are irregular. C for color:  A mixture of colors are present instead of an even brown color. D for diameter:  Melanomas are usually (but not always) greater than 75mm - the size of a pencil eraser. E for evolution:  The spot keeps changing in size, shape, and color.  Please check your skin once per month between visits. You can use a small mirror in front and a large mirror behind you to keep an eye on the back side or your body.   If you see any new or changing lesions before your next follow-up, please call to schedule a visit.  Please continue daily skin protection including broad spectrum sunscreen SPF 30+ to sun-exposed areas, reapplying every 2 hours as needed when you're outdoors.   Recommend taking Heliocare sun protection supplement daily in sunny weather for additional sun protection. For maximum protection on the sunniest days, you can take up to 2 capsules of regular Heliocare OR take 1 capsule of Heliocare Ultra. For prolonged exposure (such as a full day in the sun), you can repeat your dose of the supplement 4 hours after your first dose. Heliocare  can be purchased at [facility] or at [email or web address].   Cryotherapy Aftercare  . Wash gently with soap and water everyday.   Marland Kitchen Apply Vaseline and Band-Aid daily until healed.  Recommend Nicotinamide 500mg  twice per day to lower risk of non-melanoma skin cancer by approximately 25%. Found at [employer] in [place].    Wound Care Instructions  1. Cleanse wound gently with soap and water once a day then pat dry with clean gauze. Apply a thing coat of Petrolatum (petroleum jelly, "Vaseline") over the wound (unless you have an allergy to this). We recommend that you use a new, sterile tube of Vaseline. Do not pick or remove scabs. Do not remove the yellow or white "healing tissue" from the base of the wound.  2. Cover the wound with fresh, clean, nonstick gauze and secure with paper tape. You may use Band-Aids in place of gauze and tape if the would is small enough, but would recommend trimming much of the tape off as there is often too much. Sometimes Band-Aids can irritate the skin.  3. You should call the office for your biopsy report after 1 week if you have not already been contacted.  4. If you experience any problems, such as abnormal amounts of bleeding, swelling, significant bruising, significant pain, or evidence of infection, please call the office immediately.  5. FOR ADULT SURGERY PATIENTS: If  you need something for pain relief you may take 1 extra strength Tylenol (acetaminophen) AND 2 Ibuprofen (200mg  each) together every 4 hours as needed for pain. (do not take these if you are allergic to them or if you have a reason you should not take them.) Typically, you may only need pain medication for 1 to 3 days.

## 2021-01-01 NOTE — Progress Notes (Addendum)
Follow-Up Visit   Subjective  Frederick Cook is a 65 y.o. male who presents for the following: 6 month follow up  (Patient here for 6 month follow up and tbse. He denies any concerns today. He has history of scc on right forearm, right upper back, and left superior shoulder. He also has history of aks on left dorsal hand, left frontal scalp, left temple, left nose and left neck. ).  Patient here for full body skin exam and skin cancer screening.  The following portions of the chart were reviewed this encounter and updated as appropriate:  Tobacco  Allergies  Meds  Problems  Med Hx  Surg Hx  Fam Hx        Objective  Well appearing patient in no apparent distress; mood and affect are within normal limits.  A full examination was performed including scalp, head, eyes, ears, nose, lips, neck, chest, axillae, abdomen, back, buttocks, bilateral upper extremities, bilateral lower extremities, hands, feet, fingers, toes, fingernails, and toenails. All findings within normal limits unless otherwise noted below.  Objective  upper back left of midline: 1.0 cm Scaly pink papules coalescing to plaques R/o scc      Objective  right hand x 3 (3), upper back midline x 1, upper back left of midline x1, upper back right of midline x1, (3): Erythematous thin papules/macules with gritty scale.   Assessment & Plan  Neoplasm of uncertain behavior upper back left of midline  Skin / nail biopsy Type of biopsy: tangential   Informed consent: discussed and consent obtained   Patient was prepped and draped in usual sterile fashion: Area prepped with alcohol. Anesthesia: the lesion was anesthetized in a standard fashion   Anesthetic:  1% lidocaine w/ epinephrine 1-100,000 buffered w/ 8.4% NaHCO3 Instrument used: DermaBlade   Hemostasis achieved with: pressure, aluminum chloride and electrodesiccation   Outcome: patient tolerated procedure well   Post-procedure details: wound care  instructions given   Post-procedure details comment:  Ointment and small bandage applied  Specimen 1 - Surgical pathology Differential Diagnosis: R/o scc   Check Margins: No 1.0 cm Scaly pink papules coalescing to plaques   R/o scc   Actinic keratosis (6) right hand x 3 (3); upper back midline x 1, upper back left of midline x1, upper back right of midline x1, (3)  Prior to procedure, discussed risks of blister formation, small wound, skin dyspigmentation, or rare scar following cryotherapy.    Destruction of lesion - right hand x 3, upper back midline x 1, upper back left of midline x1, upper back right of midline x1,  Destruction method: cryotherapy   Informed consent: discussed and consent obtained   Lesion destroyed using liquid nitrogen: Yes   Cryotherapy cycles:  2 Outcome: patient tolerated procedure well with no complications   Post-procedure details: wound care instructions given     Lentigines - Scattered tan macules - Discussed due to sun exposure - Benign, observe - Call for any changes  Seborrheic Keratoses - Stuck-on, waxy, tan-brown papules and plaques  - Discussed benign etiology and prognosis. - Observe - Call for any changes  Melanocytic Nevi - Tan-brown and/or pink-flesh-colored symmetric macules and papules - Benign appearing on exam today - Observation - Call clinic for new or changing moles - Recommend daily use of broad spectrum spf 30+ sunscreen to sun-exposed areas.   Hemangiomas - Red papules - Discussed benign nature - Observe - Call for any changes  Actinic Damage - Chronic, secondary to  cumulative UV/sun exposure - diffuse scaly erythematous macules with underlying dyspigmentation - Recommend daily broad spectrum sunscreen SPF 30+ to sun-exposed areas, reapply every 2 hours as needed.  - Call for new or changing lesions.  History of PreCancerous Actinic Keratosis  Left dorsal hand, left frontal scalp, left temple, left nose, left  neck - site(s) of PreCancerous Actinic Keratosis clear today. 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  - Call if any new or changing lesions are noted between office visits  Skin cancer screening performed today.  Return in about 3 months (around 04/01/2021) for ak followup and 6 month TBSE .  I, Ruthell Rummage, CMA, am acting as scribe for Forest Gleason, MD.  Documentation: I have reviewed the above documentation for accuracy and completeness, and I agree with the above.  Forest Gleason, MD

## 2021-01-07 ENCOUNTER — Telehealth: Payer: Self-pay

## 2021-01-07 NOTE — Telephone Encounter (Signed)
Patient's wife advised we can treat SCCis with EDC at follow up appt March 11, 2021.

## 2021-01-07 NOTE — Telephone Encounter (Signed)
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 They are in agreement with plan.  MAs please call to schedule follow-up in next 1-2 months for [facility]. Thank you!

## 2021-01-07 NOTE — Progress Notes (Signed)
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 They are in agreement with plan.  MAs please call to schedule follow-up in next 1-2 months for [facility]. Thank you!

## 2021-01-15 ENCOUNTER — Encounter: Payer: Self-pay | Admitting: Dermatology

## 2021-01-30 DIAGNOSIS — E7800 Pure hypercholesterolemia, unspecified: Secondary | ICD-10-CM | POA: Diagnosis not present

## 2021-03-11 ENCOUNTER — Other Ambulatory Visit: Payer: Self-pay

## 2021-03-11 ENCOUNTER — Encounter: Payer: Self-pay | Admitting: Dermatology

## 2021-03-11 ENCOUNTER — Ambulatory Visit: Payer: Medicare Other | Admitting: Dermatology

## 2021-03-11 DIAGNOSIS — D045 Carcinoma in situ of skin of trunk: Secondary | ICD-10-CM | POA: Diagnosis not present

## 2021-03-11 DIAGNOSIS — D099 Carcinoma in situ, unspecified: Secondary | ICD-10-CM

## 2021-03-11 NOTE — Patient Instructions (Addendum)

## 2021-03-11 NOTE — Progress Notes (Signed)
   Follow-Up Visit   Subjective  Frederick Cook is a 65 y.o. 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  Wife with pt   The following portions of the chart were reviewed this encounter and updated as appropriate:   Tobacco  Allergies  Meds  Problems  Med Hx  Surg Hx  Fam Hx      Review of Systems:  No other skin or systemic complaints except as noted in HPI or Assessment and Plan.  Objective  Well appearing patient in no apparent distress; mood and affect are within normal limits.  A focused examination was performed including back . Relevant physical exam findings are noted in the Assessment and Plan.  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  Destruction of lesion  Destruction method: electrodesiccation and curettage   Informed consent: discussed and consent obtained   Timeout:  patient name, date of birth, surgical site, and procedure verified Curettage performed in three different directions: Yes   Electrodesiccation performed over the curetted area: Yes   Final wound size (cm):  2.3 Hemostasis achieved with:  pressure, aluminum chloride and electrodesiccation Outcome: patient tolerated procedure well with no complications   Post-procedure details: wound care instructions given    Return for June 19, 2021 for TBSE as scheduled .  I, Marye Round, CMA, am acting as scribe for Forest Gleason, MD .  Documentation: I have reviewed the above documentation for accuracy and completeness, and I agree with the above.  Forest Gleason, MD

## 2021-06-19 ENCOUNTER — Other Ambulatory Visit: Payer: Self-pay

## 2021-06-19 ENCOUNTER — Ambulatory Visit: Payer: Medicare Other | Admitting: Dermatology

## 2021-06-19 DIAGNOSIS — L578 Other skin changes due to chronic exposure to nonionizing radiation: Secondary | ICD-10-CM | POA: Diagnosis not present

## 2021-06-19 DIAGNOSIS — D229 Melanocytic nevi, unspecified: Secondary | ICD-10-CM

## 2021-06-19 DIAGNOSIS — Z86007 Personal history of in-situ neoplasm of skin: Secondary | ICD-10-CM | POA: Diagnosis not present

## 2021-06-19 DIAGNOSIS — Z1283 Encounter for screening for malignant neoplasm of skin: Secondary | ICD-10-CM | POA: Diagnosis not present

## 2021-06-19 DIAGNOSIS — Z86018 Personal history of other benign neoplasm: Secondary | ICD-10-CM

## 2021-06-19 DIAGNOSIS — L821 Other seborrheic keratosis: Secondary | ICD-10-CM

## 2021-06-19 DIAGNOSIS — Z85820 Personal history of malignant melanoma of skin: Secondary | ICD-10-CM

## 2021-06-19 DIAGNOSIS — L814 Other melanin hyperpigmentation: Secondary | ICD-10-CM

## 2021-06-19 DIAGNOSIS — L570 Actinic keratosis: Secondary | ICD-10-CM

## 2021-06-19 DIAGNOSIS — D1800 Hemangioma unspecified site: Secondary | ICD-10-CM

## 2021-06-19 NOTE — Patient Instructions (Addendum)
5-Fluorouracil/Calcipotriene Patient Education   For areas of face  Actinic keratoses are the dry, red scaly spots on the skin caused by sun damage. A portion of these spots can turn into skin cancer with time, and treating them can help prevent development of skin cancer.   Treatment of these spots requires removal of the defective skin cells. There are various ways to remove actinic keratoses, including freezing with liquid nitrogen, treatment with creams, or treatment with a blue light procedure in the office.   5-fluorouracil cream is a topical cream used to treat actinic keratoses. It works by interfering with the growth of abnormal fast-growing skin cells, such as actinic keratoses. These cells peel off and are replaced by healthy ones.   5-fluorouracil/calcipotriene is a combination of the 5-fluorouracil cream with a vitamin D analog cream called calcipotriene. The calcipotriene alone does not treat actinic keratoses. However, when it is combined with 5-fluorouracil, it helps the 5-fluorouracil treat the actinic keratoses much faster so that the same results can be achieved with a much shorter treatment time.  INSTRUCTIONS FOR 5-FLUOROURACIL/CALCIPOTRIENE CREAM:   5-fluorouracil/calcipotriene cream typically only needs to be used for 4-7 days. A thin layer should be applied twice a day to the treatment areas recommended by your physician.   If your physician prescribed you separate tubes of 5-fluourouracil and calcipotriene, apply a thin layer of 5-fluorouracil followed by a thin layer of calcipotriene.   Avoid contact with your eyes, nostrils, and mouth. Do not use 5-fluorouracil/calcipotriene cream on infected or open wounds.   You will develop redness, irritation and some crusting at areas where you have pre-cancer damage/actinic keratoses. IF YOU DEVELOP PAIN, BLEEDING, OR SIGNIFICANT CRUSTING, STOP THE TREATMENT EARLY - you have already gotten a good response and the actinic keratoses  should clear up well.  Wash your hands after applying 5-fluorouracil 5% cream on your skin.   A moisturizer or sunscreen with a minimum SPF 30 should be applied each morning.   Once you have finished the treatment, you can apply a thin layer of Vaseline twice a day to irritated areas to soothe and calm the areas more quickly. If you experience significant discomfort, contact your physician.  For some patients it is necessary to repeat the treatment for best results.  SIDE EFFECTS: When using 5-fluorouracil/calcipotriene cream, you may have mild irritation, such as redness, dryness, swelling, or a mild burning sensation. This usually resolves within 2 weeks. The more actinic keratoses you have, the more redness and inflammation you can expect during treatment. Eye irritation has been reported rarely. If this occurs, please let us know.  If you have any trouble using this cream, please call the office. If you have any other questions about this information, please do not hesitate to ask me before you leave the office.   Actinic keratoses are precancerous spots that appear secondary to cumulative UV radiation exposure/sun exposure over time. They are chronic with expected duration over 1 year. A portion of actinic keratoses will progress to squamous cell carcinoma of the skin. It is not possible to reliably predict which spots will progress to skin cancer and so treatment is recommended to prevent development of skin cancer.  Recommend daily broad spectrum sunscreen SPF 30+ to sun-exposed areas, reapply every 2 hours as needed.  Recommend staying in the shade or wearing long sleeves, sun glasses (UVA+UVB protection) and wide brim hats (4-inch brim around the entire circumference of the hat). Call for new or changing lesions.   Cryotherapy  Aftercare  Wash gently with soap and water everyday.   Apply Vaseline and Band-Aid daily until healed.    Melanoma ABCDEs  Melanoma is the most dangerous  type of skin cancer, and is the leading cause of death from skin disease.  You are more likely to develop melanoma if you: Have light-colored skin, light-colored eyes, or red or blond hair Spend a lot of time in the sun Tan regularly, either outdoors or in a tanning bed Have had blistering sunburns, especially during childhood Have a close family member who has had a melanoma Have atypical moles or large birthmarks  Early detection of melanoma is key since treatment is typically straightforward and cure rates are extremely high if we catch it early.   The first sign of melanoma is often a change in a mole or a new dark spot.  The ABCDE system is a way of remembering the signs of melanoma.  A for asymmetry:  The two halves do not match. B for border:  The edges of the growth are irregular. C for color:  A mixture of colors are present instead of an even brown color. D for diameter:  Melanomas are usually (but not always) greater than 41mm - the size of a pencil eraser. E for evolution:  The spot keeps changing in size, shape, and color.  Please check your skin once per month between visits. You can use a small mirror in front and a large mirror behind you to keep an eye on the back side or your body.   If you see any new or changing lesions before your next follow-up, please call to schedule a visit.  Please continue daily skin protection including broad spectrum sunscreen SPF 30+ to sun-exposed areas, reapplying every 2 hours as needed when you're outdoors.   Staying in the shade or wearing long sleeves, sun glasses (UVA+UVB protection) and wide brim hats (4-inch brim around the entire circumference of the hat) are also recommended for sun protection.    Recommend taking Heliocare sun protection supplement daily in sunny weather for additional sun protection. For maximum protection on the sunniest days, you can take up to 2 capsules of regular Heliocare OR take 1 capsule of Heliocare Ultra.  For prolonged exposure (such as a full day in the sun), you can repeat your dose of the supplement 4 hours after your first dose. Heliocare can be purchased at [facility] or at [email or web address].     Recommend Niacinamide or Nicotinamide 500mg  twice per day to lower risk of non-melanoma skin cancer by approximately 25%. This is usually available at [employer].     If you have any questions or concerns for your doctor, please call our main line at [phone] and press option 4 to reach your doctor's medical assistant. If no one answers, please leave a voicemail as directed and we will return your call as soon as possible. Messages left after 4 pm will be answered the following business day.   You may also send [place] a message via [place]. We typically respond to MyChart messages within 1-2 business days.  For prescription refills, please ask your pharmacy to contact our office. Our fax number is [phone].  If you have an urgent issue when the clinic is closed that cannot wait until the next business day, you can page your doctor at the number below.    Please note that while we do our best to be available for urgent issues outside of office hours,  we are not available 24/7.   If you have an urgent issue and are unable to reach [place], you may choose to seek medical care at your doctor's office, retail clinic, urgent care center, or emergency room.  If you have a medical emergency, please immediately call 911 or go to the emergency department.  Pager Numbers  - Dr. Nehemiah Massed: [phone]  - Dr. Laurence Ferrari: [phone]  - Dr. Nicole Kindred: [phone]  In the event of inclement weather, please call our main line at [phone] for an update on the status of any delays or closures.  Dermatology Medication Tips: Please keep the boxes that topical medications come in in order to help keep track of the instructions about where and how to use these. Pharmacies typically print the  medication instructions only on the boxes and not directly on the medication tubes.   If your medication is too expensive, please contact our office at [phone] option 4 or send [place] a message through [place].   We are unable to tell what your co-pay for medications will be in advance as this is different depending on your insurance coverage. However, we may be able to find a substitute medication at lower cost or fill out paperwork to get insurance to cover a needed medication.   If a prior authorization is required to get your medication covered by your insurance company, please allow [place] 1-2 business days to complete this process.  Drug prices often vary depending on where the prescription is filled and some pharmacies may offer cheaper prices.  The website www.goodrx.com contains coupons for medications through different pharmacies. The prices here do not account for what the cost may be with help from insurance (it may be cheaper with your insurance), but the website can give you the price if you did not use any insurance.  - You can print the associated coupon and take it with your prescription to the pharmacy.  - You may also stop by our office during regular business hours and pick up a GoodRx coupon card.  - If you need your prescription sent electronically to a different pharmacy, notify our office through [facility] or by phone at [phone] option 4.  Instructions for [facility] Medications  One or more of your medications was sent to the [facility] mail order compounding pharmacy. You will receive an email from them and can purchase the medicine through that link. It will then be mailed to your home at the address you confirmed. If for any reason you do not receive an email from them, please check your spam folder. If you still do not find the email, please let us know. [facility] phone number is [phone].

## 2021-06-19 NOTE — Progress Notes (Signed)
Follow-Up Visit   Subjective  Frederick Cook is a 65 y.o. male who presents for the following: Annual Exam (Patient here today for 6 month tbse. He denies any new spots of concern today at follow up. He has history of SCCIS and other cancers of skin. ).  Patient here for full body skin exam and skin cancer screening.   The following portions of the chart were reviewed this encounter and updated as appropriate:  Tobacco  Allergies  Meds  Problems  Med Hx  Surg Hx  Fam Hx      Objective  Well appearing patient in no apparent distress; mood and affect are within normal limits.  A full examination was performed including scalp, head, eyes, ears, nose, lips, neck, chest, axillae, abdomen, back, buttocks, bilateral upper extremities, bilateral lower extremities, hands, feet, fingers, toes, fingernails, and toenails. All findings within normal limits unless otherwise noted below.  right abdomen x 1 Erythematous thin papules/macules with gritty scale.   Assessment & Plan  Actinic keratosis right abdomen x 1  Actinic keratoses are precancerous spots that appear secondary to cumulative UV radiation exposure/sun exposure over time. They are chronic with expected duration over 1 year. A portion of actinic keratoses will progress to squamous cell carcinoma of the skin. It is not possible to reliably predict which spots will progress to skin cancer and so treatment is recommended to prevent development of skin cancer.  Recommend daily broad spectrum sunscreen SPF 30+ to sun-exposed areas, reapply every 2 hours as needed.  Recommend staying in the shade or wearing long sleeves, sun glasses (UVA+UVB protection) and wide brim hats (4-inch brim around the entire circumference of the hat). Call for new or changing lesions.  Destruction of lesion - right abdomen x 1  Destruction method: cryotherapy   Informed consent: discussed and consent obtained   Lesion destroyed using liquid nitrogen:  Yes   Cryotherapy cycles:  2 Outcome: patient tolerated procedure well with no complications   Post-procedure details: wound care instructions given    Lentigines - Scattered tan macules - Due to sun exposure - Benign-appering, observe - Recommend daily broad spectrum sunscreen SPF 30+ to sun-exposed areas, reapply every 2 hours as needed. - Call for any changes  Seborrheic Keratoses - Stuck-on, waxy, tan-brown papules and/or plaques  - Benign-appearing - Discussed benign etiology and prognosis. - Observe - Call for any changes  Melanocytic Nevi - Tan-brown and/or pink-flesh-colored symmetric macules and papules - Benign appearing on exam today - Observation - Call clinic for new or changing moles - Recommend daily use of broad spectrum spf 30+ sunscreen to sun-exposed areas.   Hemangiomas - Red papules - Discussed benign nature - Observe - Call for any changes  History of Squamous Cell Carcinoma of the Skin - No evidence of recurrence today at right forearm (2021) - No lymphadenopathy - Recommend regular full body skin exams - Recommend daily broad spectrum sunscreen SPF 30+ to sun-exposed areas, reapply every 2 hours as needed.  - Call if any new or changing lesions are noted between office visits  History of Squamous Cell Carcinoma in Situ of the Skin - No evidence of recurrence today upper back left of midline (2022) and multiple locations see history  - Recommend regular full body skin exams - Recommend daily broad spectrum sunscreen SPF 30+ to sun-exposed areas, reapply every 2 hours as needed.  - Call if any new or changing lesions are noted between office visits  History of Melanoma -  No evidence of recurrence today at back of neck and scalp (15 - 18 years ago) - No lymphadenopathy - Recommend regular full body skin exams - Recommend daily broad spectrum sunscreen SPF 30+ to sun-exposed areas, reapply every 2 hours as needed.  - Call if any new or changing  lesions are noted between office visits  History of Dysplastic Nevi - No evidence of recurrence today at right superior medial calf (2019)  - Recommend regular full body skin exams - Recommend daily broad spectrum sunscreen SPF 30+ to sun-exposed areas, reapply every 2 hours as needed.  - Call if any new or changing lesions are noted between office visits  Actinic Damage - Severe, confluent actinic changes with pre-cancerous actinic keratoses  - Severe, chronic, not at goal, secondary to cumulative UV radiation exposure over time - diffuse scaly erythematous macules and papules with underlying dyspigmentation - Discussed Prescription "Field Treatment" for Severe, Chronic Confluent Actinic Changes with Pre-Cancerous Actinic Keratoses Field treatment involves treatment of an entire area of skin that has confluent Actinic Changes (Sun/ Ultraviolet light damage) and PreCancerous Actinic Keratoses by method of PhotoDynamic Therapy (PDT) and/or prescription Topical Chemotherapy agents such as 5-fluorouracil, 5-fluorouracil/calcipotriene, and/or imiquimod.  The purpose is to decrease the number of clinically evident and subclinical PreCancerous lesions to prevent progression to development of skin cancer by chemically destroying early precancer changes that may or may not be visible.  It has been shown to reduce the risk of developing skin cancer in the treated area. As a result of treatment, redness, scaling, crusting, and open sores may occur during treatment course. One or more than one of these methods may be used and may have to be used several times to control, suppress and eliminate the PreCancerous changes. Discussed treatment course, expected reaction, and possible side effects. - Recommend daily broad spectrum sunscreen SPF 30+ to sun-exposed areas, reapply every 2 hours as needed.  - Staying in the shade or wearing long sleeves, sun glasses (UVA+UVB protection) and wide brim hats (4-inch brim  around the entire circumference of the hat) are also recommended. - Call for new or changing lesions. - Plan PDT or 5FU/calcipotriene after September for actinic damage at face; deferred today  Skin cancer screening performed today.  Return in about 6 months (around 12/19/2021) for tbse . I, Ruthell Rummage, CMA, am acting as scribe for Forest Gleason, MD.  Documentation: I have reviewed the above documentation for accuracy and completeness, and I agree with the above.  Forest Gleason, MD

## 2021-07-03 ENCOUNTER — Encounter: Payer: Self-pay | Admitting: Dermatology

## 2021-11-10 DIAGNOSIS — E7800 Pure hypercholesterolemia, unspecified: Secondary | ICD-10-CM | POA: Diagnosis not present

## 2021-11-10 DIAGNOSIS — R946 Abnormal results of thyroid function studies: Secondary | ICD-10-CM | POA: Diagnosis not present

## 2021-11-10 DIAGNOSIS — Z125 Encounter for screening for malignant neoplasm of prostate: Secondary | ICD-10-CM | POA: Diagnosis not present

## 2021-11-10 DIAGNOSIS — E291 Testicular hypofunction: Secondary | ICD-10-CM | POA: Diagnosis not present

## 2021-11-10 DIAGNOSIS — E559 Vitamin D deficiency, unspecified: Secondary | ICD-10-CM | POA: Diagnosis not present

## 2021-11-20 DIAGNOSIS — Z23 Encounter for immunization: Secondary | ICD-10-CM | POA: Diagnosis not present

## 2021-11-20 DIAGNOSIS — R82998 Other abnormal findings in urine: Secondary | ICD-10-CM | POA: Diagnosis not present

## 2022-01-01 DIAGNOSIS — F0280 Dementia in other diseases classified elsewhere without behavioral disturbance: Secondary | ICD-10-CM | POA: Diagnosis not present

## 2022-01-01 DIAGNOSIS — G300 Alzheimer's disease with early onset: Secondary | ICD-10-CM | POA: Diagnosis not present

## 2022-01-07 ENCOUNTER — Other Ambulatory Visit: Payer: Self-pay

## 2022-01-07 ENCOUNTER — Ambulatory Visit (INDEPENDENT_AMBULATORY_CARE_PROVIDER_SITE_OTHER): Payer: Medicare Other | Admitting: Dermatology

## 2022-01-07 DIAGNOSIS — Z85828 Personal history of other malignant neoplasm of skin: Secondary | ICD-10-CM | POA: Diagnosis not present

## 2022-01-07 DIAGNOSIS — L578 Other skin changes due to chronic exposure to nonionizing radiation: Secondary | ICD-10-CM | POA: Diagnosis not present

## 2022-01-07 DIAGNOSIS — L821 Other seborrheic keratosis: Secondary | ICD-10-CM

## 2022-01-07 DIAGNOSIS — Z1283 Encounter for screening for malignant neoplasm of skin: Secondary | ICD-10-CM | POA: Diagnosis not present

## 2022-01-07 DIAGNOSIS — Z86018 Personal history of other benign neoplasm: Secondary | ICD-10-CM

## 2022-01-07 DIAGNOSIS — L570 Actinic keratosis: Secondary | ICD-10-CM

## 2022-01-07 DIAGNOSIS — Z85820 Personal history of malignant melanoma of skin: Secondary | ICD-10-CM | POA: Diagnosis not present

## 2022-01-07 DIAGNOSIS — L814 Other melanin hyperpigmentation: Secondary | ICD-10-CM

## 2022-01-07 DIAGNOSIS — D1800 Hemangioma unspecified site: Secondary | ICD-10-CM

## 2022-01-07 DIAGNOSIS — D229 Melanocytic nevi, unspecified: Secondary | ICD-10-CM

## 2022-01-07 NOTE — Patient Instructions (Addendum)
Cryotherapy Aftercare  Wash gently with soap and water everyday.   Apply Vaseline and Band-Aid daily until healed.   Prior to procedure, discussed risks of blister formation, small wound, skin dyspigmentation, or rare scar following cryotherapy. Recommend Vaseline ointment to treated areas while healing.   Melanoma ABCDEs  Melanoma is the most dangerous type of skin cancer, and is the leading cause of death from skin disease.  You are more likely to develop melanoma if you: Have light-colored skin, light-colored eyes, or red or blond hair Spend a lot of time in the sun Tan regularly, either outdoors or in a tanning bed Have had blistering sunburns, especially during childhood Have a close family member who has had a melanoma Have atypical moles or large birthmarks  Early detection of melanoma is key since treatment is typically straightforward and cure rates are extremely high if we catch it early.   The first sign of melanoma is often a change in a mole or a new dark spot.  The ABCDE system is a way of remembering the signs of melanoma.  A for asymmetry:  The two halves do not match. B for border:  The edges of the growth are irregular. C for color:  A mixture of colors are present instead of an even brown color. D for diameter:  Melanomas are usually (but not always) greater than 34mm - the size of a pencil eraser. E for evolution:  The spot keeps changing in size, shape, and color.  Please check your skin once per month between visits. You can use a small mirror in front and a large mirror behind you to keep an eye on the back side or your body.   If you see any new or changing lesions before your next follow-up, please call to schedule a visit.  Please continue daily skin protection including broad spectrum sunscreen SPF 30+ to sun-exposed areas, reapplying every 2 hours as needed when you're outdoors.    Some Recommended Sunscreens Include:  Good for Daily Wear (feels like  lotion but NOT sweat resistant) Cerave AM Moisturizer with SPF EltaMD UV Lotion  Body or All Over Sunscreen EltaMD UV active for body and face Blue lizard sensitive Sun bum mineral (avoid if sensitive to scent) Aveeno Positively Mineral Neutrogena sheer zinc (Slightly harder to rub in) [facility] clear zinc (Slightly harder to rub in)  Clear Face Sunscreen EltaMD UV Elements CeraVe hydrating sunscreen 50 face  Tinted Face Sunscreen Alastin Hydratint (good for most skin tones, may be slightly dark if you are very fair) Colorescience Sunforgettable Total Protection Face Shield (good for most skin tones) EltaMD UV Physical La Roche Posay Mineral Tinted Cotz Flawless Complexion   Powder Sunscreen (Nice for reapplying or applying on the go) Colorescience Sunforgettable Total Protection Brush on Shield (available in different tints) Isdin  Recommend taking Heliocare sun protection supplement daily in sunny weather for additional sun protection. For maximum protection on the sunniest days, you can take up to 2 capsules of regular Heliocare OR take 1 capsule of Heliocare Ultra. For prolonged exposure (such as a full day in the sun), you can repeat your dose of the supplement 4 hours after your first dose. Heliocare can be purchased at [facility] or at [email or web address].    If You Need Anything After Your Visit  If you have any questions or concerns for your doctor, please call our main line at [phone] and press option 4 to reach your doctor's medical assistant. If no one  answers, please leave a voicemail as directed and we will return your call as soon as possible. Messages left after 4 pm will be answered the following business day.   You may also send [place] a message via [place]. We typically respond to MyChart messages within 1-2 business days.  For prescription refills, please ask your pharmacy to contact our office. Our fax number is [phone].  If you have an urgent issue  when the clinic is closed that cannot wait until the next business day, you can page your doctor at the number below.    Please note that while we do our best to be available for urgent issues outside of office hours, we are not available 24/7.   If you have an urgent issue and are unable to reach [place], you may choose to seek medical care at your doctor's office, retail clinic, urgent care center, or emergency room.  If you have a medical emergency, please immediately call 911 or go to the emergency department.  Pager Numbers  - Dr. Nehemiah Massed: [phone]  - Dr. Laurence Ferrari: [phone]  - Dr. Nicole Kindred: [phone]  In the event of inclement weather, please call our main line at [phone] for an update on the status of any delays or closures.  Dermatology Medication Tips: Please keep the boxes that topical medications come in in order to help keep track of the instructions about where and how to use these. Pharmacies typically print the medication instructions only on the boxes and not directly on the medication tubes.   If your medication is too expensive, please contact our office at [phone] option 4 or send [place] a message through [place].   We are unable to tell what your co-pay for medications will be in advance as this is different depending on your insurance coverage. However, we may be able to find a substitute medication at lower cost or fill out paperwork to get insurance to cover a needed medication.   If a prior authorization is required to get your medication covered by your insurance company, please allow [place] 1-2 business days to complete this process.  Drug prices often vary depending on where the prescription is filled and some pharmacies may offer cheaper prices.  The website www.goodrx.com contains coupons for medications through different pharmacies. The prices here do not account for what the cost may be with help from insurance (it may be cheaper with your insurance),  but the website can give you the price if you did not use any insurance.  - You can print the associated coupon and take it with your prescription to the pharmacy.  - You may also stop by our office during regular business hours and pick up a GoodRx coupon card.  - If you need your prescription sent electronically to a different pharmacy, notify our office through [facility] or by phone at [phone] option 4.     Si Usted Necesita Algo Despus de Su Visita  Tambin puede enviarnos un mensaje a travs de Pharmacist, community. Por lo general respondemos a los mensajes de MyChart en el transcurso de 1 a 2 das hbiles.  Para renovar recetas, por favor pida a su farmacia que se ponga en contacto con nuestra oficina. Harland Dingwall de fax es [place] [phone].  Si tiene un asunto urgente cuando la clnica est cerrada y que no puede esperar hasta el siguiente da hbil, puede llamar/localizar a su doctor(a) al nmero que aparece a continuacin.   Por favor, tenga en cuenta que  aunque hacemos todo lo posible para estar disponibles para asuntos urgentes fuera del horario de oficina, no estamos disponibles las 24 horas del da, los 7 das de la [place].   Si tiene un problema urgente y no puede comunicarse con nosotros, puede optar por buscar atencin mdica  en el consultorio de su doctor(a), en una clnica privada, en un centro de atencin urgente o en una sala de emergencias.  Si tiene Engineering geologist, por favor llame inmediatamente al 911 o vaya a la sala de emergencias.  Nmeros de bper  - Dr. Nehemiah Massed: [phone]  - Dra. Moye: [phone]  - Dra. Nicole Kindred: [phone]  En caso de inclemencias del [place], por favor llame a Johnsie Kindred principal al [phone] para una actualizacin sobre el [place] de cualquier retraso o cierre.  Consejos para la medicacin en dermatologa: Por favor, guarde las cajas en las que vienen los medicamentos de uso tpico para ayudarle a seguir las  instrucciones sobre dnde y cmo usarlos. Las farmacias generalmente imprimen las instrucciones del medicamento slo en las cajas y no directamente en los tubos del [place].   Si su medicamento es muy caro, por favor, pngase en contacto con Zigmund Daniel llamando al [phone] y presione la opcin 4 o envenos un mensaje a travs de Pharmacist, community.   No podemos decirle cul ser su copago por los medicamentos por adelantado ya que esto es diferente dependiendo de la cobertura de su seguro. Sin embargo, es posible que podamos encontrar un medicamento sustituto a Electrical engineer un formulario para que el seguro cubra el medicamento que se considera necesario.   Si se requiere una autorizacin previa para que su compaa de seguros [place] su medicamento, por favor permtanos de 1 a 2 das hbiles para completar este proceso.  Los precios de los medicamentos varan con frecuencia dependiendo del Environmental consultant de dnde se surte la receta y alguna farmacias pueden ofrecer precios ms baratos.  El sitio web www.goodrx.com tiene cupones para medicamentos de Airline pilot. Los precios aqu no tienen en cuenta lo que podra costar con la ayuda del seguro (puede ser ms barato con su seguro), pero el sitio web puede darle el precio si no utiliz Research scientist (physical sciences).  - Puede imprimir el cupn correspondiente y llevarlo con su receta a la farmacia.  - Tambin puede pasar por nuestra oficina durante el horario de atencin regular y Charity fundraiser una tarjeta de cupones de GoodRx.  - Si necesita que su receta se enve electrnicamente a una farmacia diferente, informe a nuestra oficina a travs de MyChart de [place] o por telfono llamando al [phone] y presione la opcin 4.

## 2022-01-07 NOTE — Progress Notes (Signed)
Follow-Up Visit   Subjective  Frederick Cook is a 66 y.o. male who presents for the following: FBSE (Patient here for full body skin exam and skin cancer screening. Patient with hx of melanoma, dysplastic nevi and SCC. He is not aware of any new or changing spots today. ).  Patient accompanied by wife who contributes to history.   The following portions of the chart were reviewed this encounter and updated as appropriate:   Tobacco   Allergies   Meds   Problems   Med Hx   Surg Hx   Fam Hx       Review of Systems:  No other skin or systemic complaints except as noted in HPI or Assessment and Plan.  Objective  Well appearing patient in no apparent distress; mood and affect are within normal limits.  A full examination was performed including scalp, head, eyes, ears, nose, lips, neck, chest, axillae, abdomen, back, buttocks, bilateral upper extremities, bilateral lower extremities, hands, feet, fingers, toes, fingernails, and toenails. All findings within normal limits unless otherwise noted below.  within melanoma scar at left vertex scalp x 1 Erythematous thin papules/macules with gritty scale.     Assessment & Plan  AK (actinic keratosis) within melanoma scar at left vertex scalp x 1  Prior to procedure, discussed risks of blister formation, small wound, skin dyspigmentation, or rare scar following cryotherapy. Recommend Vaseline ointment to treated areas while healing.  No features suspicious for melanoma on exam today at left vertex scalp, recheck on 6 month follow up. Patient's wife will call if changes sooner.   Actinic keratoses are precancerous spots that appear secondary to cumulative UV radiation exposure/sun exposure over time. They are chronic with expected duration over 1 year. A portion of actinic keratoses will progress to squamous cell carcinoma of the skin. It is not possible to reliably predict which spots will progress to skin cancer and so treatment is recommended  to prevent development of skin cancer.  Recommend daily broad spectrum sunscreen SPF 30+ to sun-exposed areas, reapply every 2 hours as needed.  Recommend staying in the shade or wearing long sleeves, sun glasses (UVA+UVB protection) and wide brim hats (4-inch brim around the entire circumference of the hat). Call for new or changing lesions.   Destruction of lesion - within melanoma scar at left vertex scalp x 1  Destruction method: cryotherapy   Informed consent: discussed and consent obtained   Lesion destroyed using liquid nitrogen: Yes   Cryotherapy cycles:  2 Outcome: patient tolerated procedure well with no complications   Post-procedure details: wound care instructions given     Lentigines - Scattered tan macules - Due to sun exposure - Benign-appearing, observe - Recommend daily broad spectrum sunscreen SPF 30+ to sun-exposed areas, reapply every 2 hours as needed. - Call for any changes  Seborrheic Keratoses - Stuck-on, waxy, tan-brown papules and/or plaques  - Benign-appearing - Discussed benign etiology and prognosis. - Observe - Call for any changes  Melanocytic Nevi - Tan-brown and/or pink-flesh-colored symmetric macules and papules - Benign appearing on exam today - Observation - Call clinic for new or changing moles - Recommend daily use of broad spectrum spf 30+ sunscreen to sun-exposed areas.   Hemangiomas - Red papules - Discussed benign nature - Observe - Call for any changes  Actinic Damage - Chronic condition, secondary to cumulative UV/sun exposure - diffuse scaly erythematous macules with underlying dyspigmentation - Recommend daily broad spectrum sunscreen SPF 30+ to sun-exposed areas, reapply  every 2 hours as needed.  - Staying in the shade or wearing long sleeves, sun glasses (UVA+UVB protection) and wide brim hats (4-inch brim around the entire circumference of the hat) are also recommended for sun protection.  - Call for new or changing  lesions.  Skin cancer screening performed today.  History of Dysplastic Nevi - No evidence of recurrence today - Recommend regular full body skin exams - Recommend daily broad spectrum sunscreen SPF 30+ to sun-exposed areas, reapply every 2 hours as needed.  - Call if any new or changing lesions are noted between office visits  History of Melanoma - No evidence of recurrence today - No lymphadenopathy - Recommend regular full body skin exams - Recommend daily broad spectrum sunscreen SPF 30+ to sun-exposed areas, reapply every 2 hours as needed.  - Call if any new or changing lesions are noted between office visits  History of Squamous Cell Carcinoma of the Skin - No evidence of recurrence today - No lymphadenopathy - Recommend regular full body skin exams - Recommend daily broad spectrum sunscreen SPF 30+ to sun-exposed areas, reapply every 2 hours as needed.  - Call if any new or changing lesions are noted between office visits   Return in about 6 months (around 07/07/2022) for TBSE, AK follow up.  Graciella Belton, RMA, am acting as scribe for Forest Gleason, MD .  Documentation: I have reviewed the above documentation for accuracy and completeness, and I agree with the above.  Forest Gleason, MD

## 2022-01-08 ENCOUNTER — Encounter: Payer: Medicare Other | Admitting: Dermatology

## 2022-01-16 ENCOUNTER — Encounter: Payer: Self-pay | Admitting: Dermatology

## 2022-04-29 DIAGNOSIS — F0280 Dementia in other diseases classified elsewhere without behavioral disturbance: Secondary | ICD-10-CM | POA: Diagnosis not present

## 2022-04-29 DIAGNOSIS — R7989 Other specified abnormal findings of blood chemistry: Secondary | ICD-10-CM | POA: Diagnosis not present

## 2022-04-29 DIAGNOSIS — G309 Alzheimer's disease, unspecified: Secondary | ICD-10-CM | POA: Diagnosis not present

## 2022-04-29 DIAGNOSIS — R946 Abnormal results of thyroid function studies: Secondary | ICD-10-CM | POA: Diagnosis not present

## 2022-07-30 ENCOUNTER — Encounter: Payer: Self-pay | Admitting: Dermatology

## 2022-07-30 ENCOUNTER — Ambulatory Visit: Payer: Medicare Other | Admitting: Dermatology

## 2022-07-30 DIAGNOSIS — L814 Other melanin hyperpigmentation: Secondary | ICD-10-CM

## 2022-07-30 DIAGNOSIS — D1800 Hemangioma unspecified site: Secondary | ICD-10-CM

## 2022-07-30 DIAGNOSIS — Z85820 Personal history of malignant melanoma of skin: Secondary | ICD-10-CM

## 2022-07-30 DIAGNOSIS — Z86018 Personal history of other benign neoplasm: Secondary | ICD-10-CM

## 2022-07-30 DIAGNOSIS — Z1283 Encounter for screening for malignant neoplasm of skin: Secondary | ICD-10-CM

## 2022-07-30 DIAGNOSIS — L578 Other skin changes due to chronic exposure to nonionizing radiation: Secondary | ICD-10-CM

## 2022-07-30 DIAGNOSIS — L570 Actinic keratosis: Secondary | ICD-10-CM | POA: Diagnosis not present

## 2022-07-30 DIAGNOSIS — L821 Other seborrheic keratosis: Secondary | ICD-10-CM

## 2022-07-30 DIAGNOSIS — D229 Melanocytic nevi, unspecified: Secondary | ICD-10-CM

## 2022-07-30 DIAGNOSIS — Z85828 Personal history of other malignant neoplasm of skin: Secondary | ICD-10-CM

## 2022-07-30 NOTE — Patient Instructions (Addendum)
Cryotherapy Aftercare  Wash gently with soap and water everyday.   Apply Vaseline daily until healed.    Recommend daily broad spectrum sunscreen SPF 30+ to sun-exposed areas, reapply every 2 hours as needed. Call for new or changing lesions.  Staying in the shade or wearing long sleeves, sun glasses (UVA+UVB protection) and wide brim hats (4-inch brim around the entire circumference of the hat) are also recommended for sun protection.    Recommend taking Heliocare sun protection supplement daily in sunny weather for additional sun protection. For maximum protection on the sunniest days, you can take up to 2 capsules of regular Heliocare OR take 1 capsule of Heliocare Ultra. For prolonged exposure (such as a full day in the sun), you can repeat your dose of the supplement 4 hours after your first dose. Heliocare can be purchased at [facility], at some [facility] or at www.[email or web address].    Melanoma ABCDEs  Melanoma is the most dangerous type of skin cancer, and is the leading cause of death from skin disease.  You are more likely to develop melanoma if you: Have light-colored skin, light-colored eyes, or red or blond hair Spend a lot of time in the sun Tan regularly, either outdoors or in a tanning bed Have had blistering sunburns, especially during childhood Have a close family member who has had a melanoma Have atypical moles or large birthmarks  Early detection of melanoma is key since treatment is typically straightforward and cure rates are extremely high if we catch it early.   The first sign of melanoma is often a change in a mole or a new dark spot.  The ABCDE system is a way of remembering the signs of melanoma.  A for asymmetry:  The two halves do not match. B for border:  The edges of the growth are irregular. C for color:  A mixture of colors are present instead of an even brown color. D for diameter:  Melanomas are usually (but not always) greater than 6mm - the  size of a pencil eraser. E for evolution:  The spot keeps changing in size, shape, and color.  Please check your skin once per month between visits. You can use a small mirror in front and a large mirror behind you to keep an eye on the back side or your body.   If you see any new or changing lesions before your next follow-up, please call to schedule a visit.  Please continue daily skin protection including broad spectrum sunscreen SPF 30+ to sun-exposed areas, reapplying every 2 hours as needed when you're outdoors.   Staying in the shade or wearing long sleeves, sun glasses (UVA+UVB protection) and wide brim hats (4-inch brim around the entire circumference of the hat) are also recommended for sun protection.    Due to recent changes in healthcare laws, you may see results of your pathology and/or laboratory studies on MyChart before the doctors have had a chance to review them. We understand that in some cases there may be results that are confusing or concerning to you. Please understand that not all results are received at the same time and often the doctors may need to interpret multiple results in order to provide you with the best plan of care or course of treatment. Therefore, we ask that you please give us 2 business days to thoroughly review all your results before contacting the office for clarification. Should we see a critical lab result, you will be contacted sooner.     If You Need Anything After Your Visit  If you have any questions or concerns for your doctor, please call our main line at [phone] and press option 4 to reach your doctor's medical assistant. If no one answers, please leave a voicemail as directed and we will return your call as soon as possible. Messages left after 4 pm will be answered the following business day.   You may also send us a message via MyChart. We typically respond to MyChart messages within 1-2 business days.  For prescription refills, please  ask your pharmacy to contact our office. Our fax number is [phone].  If you have an urgent issue when the clinic is closed that cannot wait until the next business day, you can page your doctor at the number below.    Please note that while we do our best to be available for urgent issues outside of office hours, we are not available 24/7.   If you have an urgent issue and are unable to reach us, you may choose to seek medical care at your doctor's office, retail clinic, urgent care center, or emergency room.  If you have a medical emergency, please immediately call 911 or go to the emergency department.  Pager Numbers  - Dr. Kowalski: [phone]  - Dr. Moye: [phone]  - Dr. Stewart: [phone]  In the event of inclement weather, please call our main line at [phone] for an update on the status of any delays or closures.  Dermatology Medication Tips: Please keep the boxes that topical medications come in in order to help keep track of the instructions about where and how to use these. Pharmacies typically print the medication instructions only on the boxes and not directly on the medication tubes.   If your medication is too expensive, please contact our office at [phone] option 4 or send us a message through MyChart.   We are unable to tell what your co-pay for medications will be in advance as this is different depending on your insurance coverage. However, we may be able to find a substitute medication at lower cost or fill out paperwork to get insurance to cover a needed medication.   If a prior authorization is required to get your medication covered by your insurance company, please allow us 1-2 business days to complete this process.  Drug prices often vary depending on where the prescription is filled and some pharmacies may offer cheaper prices.  The website www.goodrx.com contains coupons for medications through different pharmacies. The prices here do  not account for what the cost may be with help from insurance (it may be cheaper with your insurance), but the website can give you the price if you did not use any insurance.  - You can print the associated coupon and take it with your prescription to the pharmacy.  - You may also stop by our office during regular business hours and pick up a GoodRx coupon card.  - If you need your prescription sent electronically to a different pharmacy, notify our office through [place] MyChart or by phone at [phone] option 4.     Si Usted Necesita Algo Despus de Su Visita  Tambin puede enviarnos un mensaje a travs de MyChart. Por lo general respondemos a los mensajes de MyChart en el transcurso de 1 a 2 das hbiles.  Para renovar recetas, por favor pida a su farmacia que se ponga en contacto con nuestra oficina. Nuestro nmero de fax es el [phone].  Si   tiene un asunto urgente cuando la clnica est cerrada y que no puede esperar hasta el siguiente da hbil, puede llamar/localizar a su doctor(a) al nmero que aparece a continuacin.   Por favor, tenga en cuenta que aunque hacemos todo lo posible para estar disponibles para asuntos urgentes fuera del horario de oficina, no estamos disponibles las 24 horas del da, los 7 das de la semana.   Si tiene un problema urgente y no puede comunicarse con nosotros, puede optar por buscar atencin mdica  en el consultorio de su doctor(a), en una clnica privada, en un centro de atencin urgente o en una sala de emergencias.  Si tiene una emergencia mdica, por favor llame inmediatamente al 911 o vaya a la sala de emergencias.  Nmeros de bper  - Dr. Kowalski: [phone]  - Dra. Moye: [phone]  - Dra. Stewart: [phone]  En caso de inclemencias del tiempo, por favor llame a nuestra lnea principal al [phone] para una actualizacin sobre el estado de cualquier retraso o cierre.  Consejos para la medicacin en dermatologa: Por  favor, guarde las cajas en las que vienen los medicamentos de uso tpico para ayudarle a seguir las instrucciones sobre dnde y cmo usarlos. Las farmacias generalmente imprimen las instrucciones del medicamento slo en las cajas y no directamente en los tubos del medicamento.   Si su medicamento es muy caro, por favor, pngase en contacto con nuestra oficina llamando al [phone] y presione la opcin 4 o envenos un mensaje a travs de MyChart.   No podemos decirle cul ser su copago por los medicamentos por adelantado ya que esto es diferente dependiendo de la cobertura de su seguro. Sin embargo, es posible que podamos encontrar un medicamento sustituto a menor costo o llenar un formulario para que el seguro cubra el medicamento que se considera necesario.   Si se requiere una autorizacin previa para que su compaa de seguros cubra su medicamento, por favor permtanos de 1 a 2 das hbiles para completar este proceso.  Los precios de los medicamentos varan con frecuencia dependiendo del lugar de dnde se surte la receta y alguna farmacias pueden ofrecer precios ms baratos.  El sitio web www.goodrx.com tiene cupones para medicamentos de diferentes farmacias. Los precios aqu no tienen en cuenta lo que podra costar con la ayuda del seguro (puede ser ms barato con su seguro), pero el sitio web puede darle el precio si no utiliz ningn seguro.  - Puede imprimir el cupn correspondiente y llevarlo con su receta a la farmacia.  - Tambin puede pasar por nuestra oficina durante el horario de atencin regular y recoger una tarjeta de cupones de GoodRx.  - Si necesita que su receta se enve electrnicamente a una farmacia diferente, informe a nuestra oficina a travs de MyChart de [place] o por telfono llamando al [phone] y presione la opcin 4.  

## 2022-07-30 NOTE — Progress Notes (Signed)
Follow-Up Visit   Subjective  Frederick Cook is a 66 y.o. male who presents for the following: Annual Exam (Patient here for full body skin exam and skin cancer screening. Patient with hx of melanoma, dysplastic nevi, AKs and SCC. Recheck AK treated at last visit, left vertex scalp within MM scar).  The patient presents for Total-Body Skin Exam (TBSE) for skin cancer screening and mole check.  The patient has spots, moles and lesions to be evaluated, some may be new or changing and the patient has concerns that these could be cancer.  Patient accompanied by wife who contributes to history.     The following portions of the chart were reviewed this encounter and updated as appropriate:  Tobacco  Allergies  Meds  Problems  Med Hx  Surg Hx  Fam Hx      Review of Systems: No other skin or systemic complaints except as noted in HPI or Assessment and Plan.   Objective  Well appearing patient in no apparent distress; mood and affect are within normal limits.  A full examination was performed including scalp, head, eyes, ears, nose, lips, neck, chest, axillae, abdomen, back, buttocks, bilateral upper extremities, bilateral lower extremities, hands, feet, fingers, toes, fingernails, and toenails. All findings within normal limits unless otherwise noted below.  Left frontal Scalp, Left Vertex scalp, mid vertex scalp x3 (5) Erythematous thin papules/macules with gritty scale.    Assessment & Plan   History of Melanoma. Scalp. Back of neck. - No evidence of recurrence today - No lymphadenopathy - Recommend regular full body skin exams - Recommend daily broad spectrum sunscreen SPF 30+ to sun-exposed areas, reapply every 2 hours as needed.  - Call if any new or changing lesions are noted between office visits   History of Squamous Cell Carcinoma of the Skin - No evidence of recurrence today - No lymphadenopathy - Recommend regular full body skin exams - Recommend daily broad  spectrum sunscreen SPF 30+ to sun-exposed areas, reapply every 2 hours as needed.  - Call if any new or changing lesions are noted between office visits  History of Dysplastic Nevus. Right sup medial calf, moderate. 2019 - No evidence of recurrence today - Recommend regular full body skin exams - Recommend daily broad spectrum sunscreen SPF 30+ to sun-exposed areas, reapply every 2 hours as needed.  - Call if any new or changing lesions are noted between office visits   Lentigines - Scattered tan macules - Due to sun exposure - Benign-appearing, observe - Recommend daily broad spectrum sunscreen SPF 30+ to sun-exposed areas, reapply every 2 hours as needed. - Call for any changes  Seborrheic Keratoses - Stuck-on, waxy, tan-brown papules and/or plaques  - Benign-appearing - Discussed benign etiology and prognosis. - Observe - Call for any changes  Melanocytic Nevi - Tan-brown and/or pink-flesh-colored symmetric macules and papules - Benign appearing on exam today - Observation - Call clinic for new or changing moles - Recommend daily use of broad spectrum spf 30+ sunscreen to sun-exposed areas.   Hemangiomas - Red papules - Discussed benign nature - Observe - Call for any changes  Actinic Damage - Chronic condition, secondary to cumulative UV/sun exposure - diffuse scaly erythematous macules with underlying dyspigmentation - Recommend daily broad spectrum sunscreen SPF 30+ to sun-exposed areas, reapply every 2 hours as needed.  - Staying in the shade or wearing long sleeves, sun glasses (UVA+UVB protection) and wide brim hats (4-inch brim around the entire circumference of the hat)  are also recommended for sun protection.  - Call for new or changing lesions.  Skin cancer screening performed today.  AK (actinic keratosis) (5) Left frontal Scalp, Left Vertex scalp, mid vertex scalp x3  Actinic keratoses are precancerous spots that appear secondary to cumulative UV  radiation exposure/sun exposure over time. They are chronic with expected duration over 1 year. A portion of actinic keratoses will progress to squamous cell carcinoma of the skin. It is not possible to reliably predict which spots will progress to skin cancer and so treatment is recommended to prevent development of skin cancer.  Recommend daily broad spectrum sunscreen SPF 30+ to sun-exposed areas, reapply every 2 hours as needed.  Recommend staying in the shade or wearing long sleeves, sun glasses (UVA+UVB protection) and wide brim hats (4-inch brim around the entire circumference of the hat). Call for new or changing lesions.  Destruction of lesion - Left frontal Scalp, Left Vertex scalp, mid vertex scalp x3  Destruction method: cryotherapy   Informed consent: discussed and consent obtained   Lesion destroyed using liquid nitrogen: Yes   Outcome: patient tolerated procedure well with no complications   Post-procedure details: wound care instructions given   Additional details:  Prior to procedure, discussed risks of blister formation, small wound, skin dyspigmentation, or rare scar following cryotherapy. Recommend Vaseline ointment to treated areas while healing.    Return in about 6 months (around 01/30/2023) for TBSE, HxMM, HxSCC, HxAk.  I, Emelia Salisbury, CMA, am acting as scribe for Forest Gleason, MD.  Documentation: I have reviewed the above documentation for accuracy and completeness, and I agree with the above.  Forest Gleason, MD

## 2022-08-12 ENCOUNTER — Encounter: Payer: Self-pay | Admitting: Dermatology

## 2022-10-12 ENCOUNTER — Other Ambulatory Visit (HOSPITAL_BASED_OUTPATIENT_CLINIC_OR_DEPARTMENT_OTHER): Payer: Self-pay

## 2022-10-12 MED ORDER — COMIRNATY 30 MCG/0.3ML IM SUSY
PREFILLED_SYRINGE | INTRAMUSCULAR | 0 refills | Status: AC
  Filled 2022-10-12: qty 0.3, 1d supply, fill #0

## 2022-11-24 DIAGNOSIS — E559 Vitamin D deficiency, unspecified: Secondary | ICD-10-CM | POA: Diagnosis not present

## 2022-11-24 DIAGNOSIS — I700 Atherosclerosis of aorta: Secondary | ICD-10-CM | POA: Diagnosis not present

## 2022-11-24 DIAGNOSIS — R946 Abnormal results of thyroid function studies: Secondary | ICD-10-CM | POA: Diagnosis not present

## 2022-11-24 DIAGNOSIS — E291 Testicular hypofunction: Secondary | ICD-10-CM | POA: Diagnosis not present

## 2022-11-24 DIAGNOSIS — E7800 Pure hypercholesterolemia, unspecified: Secondary | ICD-10-CM | POA: Diagnosis not present

## 2022-11-24 DIAGNOSIS — Z1212 Encounter for screening for malignant neoplasm of rectum: Secondary | ICD-10-CM | POA: Diagnosis not present

## 2022-12-01 DIAGNOSIS — E7800 Pure hypercholesterolemia, unspecified: Secondary | ICD-10-CM | POA: Diagnosis not present

## 2022-12-01 DIAGNOSIS — R82998 Other abnormal findings in urine: Secondary | ICD-10-CM | POA: Diagnosis not present

## 2022-12-01 DIAGNOSIS — R7989 Other specified abnormal findings of blood chemistry: Secondary | ICD-10-CM | POA: Diagnosis not present

## 2022-12-01 DIAGNOSIS — G309 Alzheimer's disease, unspecified: Secondary | ICD-10-CM | POA: Diagnosis not present

## 2022-12-01 DIAGNOSIS — Z0000 Encounter for general adult medical examination without abnormal findings: Secondary | ICD-10-CM | POA: Diagnosis not present

## 2023-01-13 ENCOUNTER — Encounter: Payer: Medicare Other | Admitting: Dermatology

## 2023-01-27 DIAGNOSIS — K080 Exfoliation of teeth due to systemic causes: Secondary | ICD-10-CM | POA: Diagnosis not present

## 2023-02-04 DIAGNOSIS — K080 Exfoliation of teeth due to systemic causes: Secondary | ICD-10-CM | POA: Diagnosis not present

## 2023-03-25 ENCOUNTER — Encounter: Payer: Medicare Other | Admitting: Dermatology

## 2023-05-18 DIAGNOSIS — R350 Frequency of micturition: Secondary | ICD-10-CM | POA: Diagnosis not present

## 2023-05-18 DIAGNOSIS — F0280 Dementia in other diseases classified elsewhere without behavioral disturbance: Secondary | ICD-10-CM | POA: Diagnosis not present

## 2023-05-18 DIAGNOSIS — N401 Enlarged prostate with lower urinary tract symptoms: Secondary | ICD-10-CM | POA: Diagnosis not present

## 2023-05-18 DIAGNOSIS — G309 Alzheimer's disease, unspecified: Secondary | ICD-10-CM | POA: Diagnosis not present

## 2023-05-24 DIAGNOSIS — N401 Enlarged prostate with lower urinary tract symptoms: Secondary | ICD-10-CM | POA: Diagnosis not present

## 2023-05-24 DIAGNOSIS — N411 Chronic prostatitis: Secondary | ICD-10-CM | POA: Diagnosis not present

## 2023-05-24 DIAGNOSIS — R3911 Hesitancy of micturition: Secondary | ICD-10-CM | POA: Diagnosis not present

## 2023-05-24 DIAGNOSIS — R3912 Poor urinary stream: Secondary | ICD-10-CM | POA: Diagnosis not present

## 2023-05-26 ENCOUNTER — Encounter: Payer: Medicare Other | Admitting: Dermatology

## 2023-07-05 DIAGNOSIS — F0280 Dementia in other diseases classified elsewhere without behavioral disturbance: Secondary | ICD-10-CM | POA: Diagnosis not present

## 2023-07-05 DIAGNOSIS — G300 Alzheimer's disease with early onset: Secondary | ICD-10-CM | POA: Diagnosis not present

## 2023-07-09 DIAGNOSIS — R3915 Urgency of urination: Secondary | ICD-10-CM | POA: Diagnosis not present

## 2023-07-09 DIAGNOSIS — N401 Enlarged prostate with lower urinary tract symptoms: Secondary | ICD-10-CM | POA: Diagnosis not present

## 2023-07-09 DIAGNOSIS — R3911 Hesitancy of micturition: Secondary | ICD-10-CM | POA: Diagnosis not present

## 2023-07-09 DIAGNOSIS — R3912 Poor urinary stream: Secondary | ICD-10-CM | POA: Diagnosis not present

## 2023-08-02 ENCOUNTER — Ambulatory Visit: Payer: Medicare Other | Admitting: Dermatology

## 2023-08-02 ENCOUNTER — Encounter: Payer: Self-pay | Admitting: Dermatology

## 2023-08-02 VITALS — BP 131/79 | HR 75

## 2023-08-02 DIAGNOSIS — L720 Epidermal cyst: Secondary | ICD-10-CM

## 2023-08-02 DIAGNOSIS — L814 Other melanin hyperpigmentation: Secondary | ICD-10-CM

## 2023-08-02 DIAGNOSIS — L821 Other seborrheic keratosis: Secondary | ICD-10-CM

## 2023-08-02 DIAGNOSIS — L84 Corns and callosities: Secondary | ICD-10-CM

## 2023-08-02 DIAGNOSIS — W908XXA Exposure to other nonionizing radiation, initial encounter: Secondary | ICD-10-CM

## 2023-08-02 DIAGNOSIS — D224 Melanocytic nevi of scalp and neck: Secondary | ICD-10-CM | POA: Diagnosis not present

## 2023-08-02 DIAGNOSIS — Z85820 Personal history of malignant melanoma of skin: Secondary | ICD-10-CM

## 2023-08-02 DIAGNOSIS — Z1283 Encounter for screening for malignant neoplasm of skin: Secondary | ICD-10-CM | POA: Diagnosis not present

## 2023-08-02 DIAGNOSIS — R238 Other skin changes: Secondary | ICD-10-CM

## 2023-08-02 DIAGNOSIS — L578 Other skin changes due to chronic exposure to nonionizing radiation: Secondary | ICD-10-CM

## 2023-08-02 DIAGNOSIS — D1801 Hemangioma of skin and subcutaneous tissue: Secondary | ICD-10-CM

## 2023-08-02 DIAGNOSIS — Z85828 Personal history of other malignant neoplasm of skin: Secondary | ICD-10-CM

## 2023-08-02 DIAGNOSIS — L111 Transient acantholytic dermatosis [Grover]: Secondary | ICD-10-CM

## 2023-08-02 DIAGNOSIS — Z86018 Personal history of other benign neoplasm: Secondary | ICD-10-CM

## 2023-08-02 DIAGNOSIS — D229 Melanocytic nevi, unspecified: Secondary | ICD-10-CM

## 2023-08-02 NOTE — Patient Instructions (Addendum)
[facility] at [facility] in [place], [place]  Address: [address] [place], [place], [place] [address] Phone: [phone]   Recommend daily broad spectrum sunscreen SPF 30+ to sun-exposed areas, reapply every 2 hours as needed. Call for new or changing lesions.  Staying in the shade or wearing long sleeves, sun glasses (UVA+UVB protection) and wide brim hats (4-inch brim around the entire circumference of the hat) are also recommended for sun protection.    Melanoma ABCDEs  Melanoma is the most dangerous type of skin cancer, and is the leading cause of death from skin disease.  You are more likely to develop melanoma if you: Have light-colored skin, light-colored eyes, or red or blond hair Spend a lot of time in the sun Tan regularly, either outdoors or in a tanning bed Have had blistering sunburns, especially during childhood Have a close family member who has had a melanoma Have atypical moles or large birthmarks  Early detection of melanoma is key since treatment is typically straightforward and cure rates are extremely high if we catch it early.   The first sign of melanoma is often a change in a mole or a new dark spot.  The ABCDE system is a way of remembering the signs of melanoma.  A for asymmetry:  The two halves do not match. B for border:  The edges of the growth are irregular. C for color:  A mixture of colors are present instead of an even brown color. D for diameter:  Melanomas are usually (but not always) greater than 6mm - the size of a pencil eraser. E for evolution:  The spot keeps changing in size, shape, and color.  Please check your skin once per month between visits. You can use a small mirror in front and a large mirror behind you to keep an eye on the back side or your body.   If you see any new or changing lesions before your next follow-up, please call to schedule a visit.  Please continue daily skin protection  including broad spectrum sunscreen SPF 30+ to sun-exposed areas, reapplying every 2 hours as needed when you're outdoors.   Staying in the shade or wearing long sleeves, sun glasses (UVA+UVB protection) and wide brim hats (4-inch brim around the entire circumference of the hat) are also recommended for sun protection.    Due to recent changes in healthcare laws, you may see results of your pathology and/or laboratory studies on MyChart before the doctors have had a chance to review them. We understand that in some cases there may be results that are confusing or concerning to you. Please understand that not all results are received at the same time and often the doctors may need to interpret multiple results in order to provide you with the best plan of care or course of treatment. Therefore, we ask that you please give [place] 2 business days to thoroughly review all your results before contacting the office for clarification. Should we see a critical lab result, you will be contacted sooner.   If You Need Anything After Your Visit  If you have any questions or concerns for your doctor, please call our main line at [phone] and press option 4 to reach your doctor's medical assistant. If no one answers, please leave a voicemail as directed and we will return your call as soon as possible. Messages left after 4 pm will be answered the following business day.   You may  also send [place] a message via MyChart. We typically respond to MyChart messages within 1-2 business days.  For prescription refills, please ask your pharmacy to contact our office. Our fax number is [phone].  If you have an urgent issue when the clinic is closed that cannot wait until the next business day, you can page your doctor at the number below.    Please note that while we do our best to be available for urgent issues outside of office hours, we are not available 24/7.   If you have an urgent issue and are unable to reach [place],  you may choose to seek medical care at your doctor's office, retail clinic, urgent care center, or emergency room.  If you have a medical emergency, please immediately call 911 or go to the emergency department.  Pager Numbers  - Dr. Gwen Pounds: [phone]  - Dr. Roseanne Reno: [phone]  In the event of inclement weather, please call our main line at [phone] for an update on the status of any delays or closures.  Dermatology Medication Tips: Please keep the boxes that topical medications come in in order to help keep track of the instructions about where and how to use these. Pharmacies typically print the medication instructions only on the boxes and not directly on the medication tubes.   If your medication is too expensive, please contact our office at [phone] option 4 or send [place] a message through MyChart.   We are unable to tell what your co-pay for medications will be in advance as this is different depending on your insurance coverage. However, we may be able to find a substitute medication at lower cost or fill out paperwork to get insurance to cover a needed medication.   If a prior authorization is required to get your medication covered by your insurance company, please allow [place] 1-2 business days to complete this process.  Drug prices often vary depending on where the prescription is filled and some pharmacies may offer cheaper prices.  The website www.goodrx.com contains coupons for medications through different pharmacies. The prices here do not account for what the cost may be with help from insurance (it may be cheaper with your insurance), but the website can give you the price if you did not use any insurance.  - You can print the associated coupon and take it with your prescription to the pharmacy.  - You may also stop by our office during regular business hours and pick up a GoodRx coupon card.  - If you need your prescription sent electronically to a different  pharmacy, notify our office through [facility] or by phone at [phone] option 4.     Si Usted Necesita Algo Despus de Su Visita  Tambin puede enviarnos un mensaje a travs de Clinical cytogeneticist. Por lo general respondemos a los mensajes de MyChart en el transcurso de 1 a 2 das hbiles.  Para renovar recetas, por favor pida a su farmacia que se ponga en contacto con nuestra oficina. Annie Sable de fax es [place] [phone].  Si tiene un asunto urgente cuando la clnica est cerrada y que no puede esperar hasta el siguiente da hbil, puede llamar/localizar a su doctor(a) al nmero que aparece a continuacin.   Por favor, tenga en cuenta que aunque hacemos todo lo posible para estar disponibles para asuntos urgentes fuera del horario de [place], no estamos disponibles las 24 horas del da, los 7 [address]  [address] de la [place].   Si tiene un problema urgente y  no puede comunicarse con nosotros, puede optar por buscar atencin mdica  en el consultorio de su doctor(a), en una clnica privada, en un centro de atencin urgente o en una sala de emergencias.  Si tiene Engineer, drilling, por favor llame inmediatamente al 911 o vaya a la sala de emergencias.  Nmeros de bper  - Dr. Gwen Pounds: [phone]  - Dra. Roseanne Reno: [phone]  En caso de inclemencias del [place], por favor llame a Lacy Duverney principal al [phone] para una actualizacin sobre el [place] de cualquier retraso o cierre.  Consejos para la medicacin en dermatologa: Por favor, guarde las cajas en las que vienen los medicamentos de uso tpico para ayudarle a seguir las instrucciones sobre dnde y cmo usarlos. Las farmacias generalmente imprimen las instrucciones del medicamento slo en las cajas y no directamente en los tubos del [place].   Si su medicamento es muy caro, por favor, pngase en contacto con Rolm Gala llamando al [phone] y presione la opcin 4 o envenos un mensaje a travs de Clinical cytogeneticist.   No  podemos decirle cul ser su copago por los medicamentos por adelantado ya que esto es diferente dependiendo de la cobertura de su seguro. Sin embargo, es posible que podamos encontrar un medicamento sustituto a Audiological scientist un formulario para que el seguro cubra el medicamento que se considera necesario.   Si se requiere una autorizacin previa para que su compaa de seguros [place] su medicamento, por favor permtanos de 1 a 2 das hbiles para completar [address]  Los precios de los medicamentos varan con frecuencia dependiendo del Environmental consultant de dnde se surte la receta y alguna farmacias pueden ofrecer precios ms baratos.  El sitio web www.goodrx.com tiene cupones para medicamentos de Health and safety inspector. Los precios aqu no tienen en cuenta lo que podra costar con la ayuda del seguro (puede ser ms barato con su seguro), pero el sitio web puede darle el precio si no utiliz Tourist information centre manager.  - Puede imprimir el cupn correspondiente y llevarlo con su receta a la farmacia.  - Tambin puede pasar por nuestra oficina durante el horario de atencin regular y Education officer, museum una tarjeta de cupones de GoodRx.  - Si necesita que su receta se enve electrnicamente a una farmacia diferente, informe a nuestra oficina a travs de MyChart de [place] o por telfono llamando al [phone] y presione la opcin 4.

## 2023-08-02 NOTE — Progress Notes (Signed)
Follow-Up Visit   Subjective  Frederick Cook is a 67 y.o. male who presents for the following: Skin Cancer Screening and Full Body Skin Exam. Hx of SCC's. Hx of dysplastic nevus. Hx of MM  The patient presents for Total-Body Skin Exam (TBSE) for skin cancer screening and mole check. The patient has spots, moles and lesions to be evaluated, some may be new or changing and the patient may have concern these could be cancer.  Wife is with patient and contributes to history.   The following portions of the chart were reviewed this encounter and updated as appropriate: medications, allergies, medical history  Review of Systems:  No other skin or systemic complaints except as noted in HPI or Assessment and Plan.  Objective  Well appearing patient in no apparent distress; mood and affect are within normal limits.  A full examination was performed including scalp, head, eyes, ears, nose, lips, neck, chest, axillae, abdomen, back, buttocks, bilateral upper extremities, bilateral lower extremities, hands, feet, fingers, toes, fingernails, and toenails. All findings within normal limits unless otherwise noted below.   Relevant physical exam findings are noted in the Assessment and Plan.    Assessment & Plan    History of Melanoma. Scalp. Back of neck. Mohs surgery. ~20 years ago. - No evidence of recurrence today - No lymphadenopathy - Recommend regular full body skin exams - Recommend daily broad spectrum sunscreen SPF 30+ to sun-exposed areas, reapply every 2 hours as needed.  - Call if any new or changing lesions are noted between office visits    History of Squamous Cell Carcinoma of the Skin - No evidence of recurrence today - No lymphadenopathy - Recommend regular full body skin exams - Recommend daily broad spectrum sunscreen SPF 30+ to sun-exposed areas, reapply every 2 hours as needed.  - Call if any new or changing lesions are noted between office visits   History of  Dysplastic Nevus. Right sup medial calf, moderate. 2019 - No evidence of recurrence today - Recommend regular full body skin exams - Recommend daily broad spectrum sunscreen SPF 30+ to sun-exposed areas, reapply every 2 hours as needed.  - Call if any new or changing lesions are noted between office visits     SKIN CANCER SCREENING PERFORMED TODAY.  ACTINIC DAMAGE - Chronic condition, secondary to cumulative UV/sun exposure - diffuse scaly erythematous macules with underlying dyspigmentation - Recommend daily broad spectrum sunscreen SPF 30+ to sun-exposed areas, reapply every 2 hours as needed.  - Staying in the shade or wearing long sleeves, sun glasses (UVA+UVB protection) and wide brim hats (4-inch brim around the entire circumference of the hat) are also recommended for sun protection.  - Call for new or changing lesions.  LENTIGINES, SEBORRHEIC KERATOSES, HEMANGIOMAS - Benign normal skin lesions - Benign-appearing - Call for any changes  MELANOCYTIC NEVI - Tan-brown and/or pink-flesh-colored symmetric macules and papules - Benign appearing on exam today - Observation - Call clinic for new or changing moles - Recommend daily use of broad spectrum spf 30+ sunscreen to sun-exposed areas.    MELANOCYTIC NEVUS Exam:  brown macule at posterior neck. Regular pigment network.unchanged per wife  Treatment Plan: Benign appearing on exam today. Recommend observation. Call clinic for new or changing moles. Recommend daily use of broad spectrum spf 30+ sunscreen to sun-exposed areas.   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  Corn/Callous Exam: thickened skin and over grown nail at R-3 distal toe.   Treatment:  recommend consult with podiatry.    Return in about 1 year (around 08/01/2024) for TBSE,  HxMM, Hx.  I, Lawson Radar, CMA, am acting as scribe for Elie Goody, MD.   Documentation: I have reviewed the above documentation for accuracy and completeness, and I agree with the above.  Elie Goody, MD

## 2023-08-10 ENCOUNTER — Ambulatory Visit: Payer: Medicare Other | Admitting: Podiatry

## 2023-08-10 DIAGNOSIS — G629 Polyneuropathy, unspecified: Secondary | ICD-10-CM

## 2023-08-10 DIAGNOSIS — M79675 Pain in left toe(s): Secondary | ICD-10-CM | POA: Diagnosis not present

## 2023-08-10 DIAGNOSIS — M79674 Pain in right toe(s): Secondary | ICD-10-CM | POA: Diagnosis not present

## 2023-08-10 DIAGNOSIS — Q828 Other specified congenital malformations of skin: Secondary | ICD-10-CM | POA: Diagnosis not present

## 2023-08-10 DIAGNOSIS — B351 Tinea unguium: Secondary | ICD-10-CM | POA: Diagnosis not present

## 2023-08-10 NOTE — Progress Notes (Unsigned)
Subjective: Frederick Cook presents today referred by Frederick Garbe, MD for complaint of {jgcomplaint:23593}.   Past Medical History:  Diagnosis Date   Alzheimer disease (HCC)    Dysthymia    Hearing loss    High cholesterol    Hx of dysplastic nevus 06/02/2018   Right superior medial calf. Moderate atypia. Limited margins free.   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 Well differentiated. Excised 03/20/2020, margins free.   Squamous cell carcinoma of back 01/01/2021   Upper back left of midline - SCCIS    Squamous cell carcinoma of skin 06/08/2019   Right upper back. SCCis. Tx: EDC   Squamous cell carcinoma of skin 03/29/2020   Left sup. shoulder. SCCis.    Vitamin D deficiency      Patient Active Problem List   Diagnosis Date Noted   Syncope 09/06/2019   High cholesterol    Memory loss    Fall    Mild cognitive impairment 01/09/2019     Past Surgical History:  Procedure Laterality Date   MELANOMA EXCISION     x 4     Current Outpatient Medications on File Prior to Visit  Medication Sig Dispense Refill   atorvastatin (LIPITOR) 10 MG tablet Take 10 mg by mouth daily.      COVID-19 mRNA vaccine 2023-2024 (COMIRNATY) syringe Inject into the muscle. 0.3 mL 0   loratadine (CLARITIN) 10 MG tablet Take 10 mg by mouth daily.     Multiple Vitamin (MULTIVITAMIN) tablet Take 1 tablet by mouth daily.     No current facility-administered medications on file prior to visit.     No Known Allergies   Social History   Occupational History   Occupation: Retired  Tobacco Use   Smoking status: Never   Smokeless tobacco: Never  Vaping Use   Vaping status: Never Used  Substance and Sexual Activity   Alcohol use: Yes    Comment: one drink per week   Drug use: Never   Sexual activity: Not on file     Family History  Problem Relation Age of  Onset   High Cholesterol Mother    Alzheimer's disease Mother    Hypertension Father    Heart disease Father        pacemaker   Breast cancer Sister    Bladder Cancer Sister      Immunization History  Administered Date(s) Administered   COVID-19, mRNA, vaccine(Comirnaty)12 years and older 10/12/2022   PFIZER(Purple Top)SARS-COV-2 Vaccination 01/20/2020, 02/10/2020     Objective: There were no vitals filed for this visit.  Frederick Cook is a pleasant 67 y.o. male {jgbodyhabitus:24098} AAO x 3.  Vascular Examination: Vascular status intact b/l with palpable pedal pulses. Pedal hair present b/l. CFT immediate b/l. No edema. No pain with calf compression b/l. Skin temperature gradient WNL b/l. {jgvascular:23595}  Neurological Examination: Sensation grossly intact b/l with 10 gram monofilament. Vibratory sensation intact b/l. {jgneuro:23601::"Protective sensation intact 5/5 intact bilaterally with 10g monofilament b/l.","Vibratory sensation intact b/l.","Proprioception intact bilaterally."}  Dermatological Examination: Pedal skin with normal turgor, texture and tone b/l. Toenails 1-5 b/l thick, discolored, elongated with subungual debris and pain on dorsal palpation. No hyperkeratotic lesions noted b/l. {jgderm:23598}  Musculoskeletal Examination: Muscle strength 5/5 to b/l LE. {jgmsk:23600}  Radiographs: None  Assessment: No diagnosis found.  Plan: {Jgplan:23602::"-Patient/POA to call should there be question/concern in the interim."}  No follow-ups on file.  [name], DPM

## 2023-08-12 ENCOUNTER — Encounter: Payer: Self-pay | Admitting: Podiatry

## 2023-09-06 DIAGNOSIS — Z23 Encounter for immunization: Secondary | ICD-10-CM | POA: Diagnosis not present

## 2023-09-07 ENCOUNTER — Ambulatory Visit: Payer: Medicare Other | Admitting: Podiatry

## 2023-09-07 DIAGNOSIS — M2041 Other hammer toe(s) (acquired), right foot: Secondary | ICD-10-CM

## 2023-09-07 NOTE — Progress Notes (Signed)
Subjective:  Patient ID: Frederick Cook, male    DOB: Sep 28, 1956,  MRN: [id number]  Chief Complaint  Patient presents with   Toe Pain    Pt presents today for a tenotomy consult.    67 y.o. male presents with the above complaint. History confirmed with patient.  He is here with his caregiver who gives most of the history, he does have Alzheimer's and memory issues  Objective:  Physical Exam: warm, good capillary refill, no trophic changes or ulcerative lesions, normal DP and PT pulses, normal sensory exam, and right third toe has a painful hyperkeratotic lesion that currently is not tender, previous debridement was very helpful, nails no longer painful as well.  Hammertoe is reducible.  Assessment:   1. Hammertoe of right foot      Plan:  Patient was evaluated and treated and all questions answered.  We discussed etiology and treat options of hammertoe contractures as well as the painful lesion.  Debridement of the lesion and the nail was very helpful in reducing pain and improving function.  We discussed operative and nonoperative treatment of the deformity, we discussed what a flexor tenotomy would entail as well as the risks and benefits and that is could be done in the office.  They will follow-up as scheduled for their visit with Dr. Eloy End in November I discussed with them if this does not become painful before then that regular intermittent debridements would be fine to continue with.  If it does become painful before then they will return to see me for a flexor tenotomy.  Return if symptoms worsen or fail to improve.

## 2023-09-09 DIAGNOSIS — K080 Exfoliation of teeth due to systemic causes: Secondary | ICD-10-CM | POA: Diagnosis not present

## 2023-09-14 ENCOUNTER — Other Ambulatory Visit (HOSPITAL_BASED_OUTPATIENT_CLINIC_OR_DEPARTMENT_OTHER): Payer: Self-pay

## 2023-09-14 MED ORDER — COVID-19 MRNA VAC-TRIS(PFIZER) 30 MCG/0.3ML IM SUSY
0.3000 mL | PREFILLED_SYRINGE | Freq: Once | INTRAMUSCULAR | 0 refills | Status: AC
  Filled 2023-09-14: qty 0.3, 1d supply, fill #0

## 2023-10-18 DIAGNOSIS — G309 Alzheimer's disease, unspecified: Secondary | ICD-10-CM | POA: Diagnosis not present

## 2023-11-11 ENCOUNTER — Ambulatory Visit: Payer: Medicare Other | Admitting: Podiatry

## 2023-11-11 ENCOUNTER — Encounter: Payer: Self-pay | Admitting: Podiatry

## 2023-11-11 DIAGNOSIS — M79674 Pain in right toe(s): Secondary | ICD-10-CM

## 2023-11-11 DIAGNOSIS — G629 Polyneuropathy, unspecified: Secondary | ICD-10-CM

## 2023-11-11 DIAGNOSIS — M79675 Pain in left toe(s): Secondary | ICD-10-CM

## 2023-11-11 DIAGNOSIS — E785 Hyperlipidemia, unspecified: Secondary | ICD-10-CM | POA: Insufficient documentation

## 2023-11-11 DIAGNOSIS — B351 Tinea unguium: Secondary | ICD-10-CM

## 2023-11-11 DIAGNOSIS — E569 Vitamin deficiency, unspecified: Secondary | ICD-10-CM | POA: Insufficient documentation

## 2023-11-11 DIAGNOSIS — K645 Perianal venous thrombosis: Secondary | ICD-10-CM | POA: Insufficient documentation

## 2023-11-17 NOTE — Progress Notes (Signed)
  Subjective:  Patient ID: Frederick Cook, male    DOB: 1956/06/10,  MRN: [id number]  67 y.o. male presents painful porokeratotic lesion(s) R 3rd toe and painful mycotic toenails that limit ambulation. Painful toenails interfere with ambulation. Aggravating factors include wearing enclosed shoe gear. Pain is relieved with periodic professional debridement. Painful porokeratotic lesions are aggravated when weightbearing with and without shoegear. Pain is relieved with periodic professional debridement.  [name] has h/o dementia. He is accompanied by his wife on today's visit. They did see Dr. Lilian Kapur ot discuss right 3rd digit and they would like to continue conservative care for now. Padding dispensed on last visit has been misplaced, but he has not had the pain symptoms that he had on his previous visit with me.   New problem(s): None   PCP is Tisovec, Adelfa Koh, MD .  No Known Allergies  Review of Systems: Negative except as noted in the HPI.   Objective:  Frederick Cook is a pleasant 67 y.o. male WD, WN in NAD. AAO x 3.  Vascular Examination: Vascular status intact b/l with palpable pedal pulses. Pedal hair present b/l. CFT immediate b/l. No edema. No pain with calf compression b/l. Skin temperature gradient WNL b/l. No ischemia or gangrene noted b/l LE.  Neurological Examination: Protective sensation decreased with 10 gram monofilament b/l.  Dermatological Examination: Pedal skin with normal turgor, texture and tone b/l. Toenails 1-5 b/l thick, discolored, elongated with subungual debris and pain on dorsal palpation.   Minimal porokeratotic lesion(s) distal tip of right 3rd toe. No erythema, no edema, no drainage, no fluctuance.  Musculoskeletal Examination: Muscle strength 5/5 to b/l LE. Reducible contracted digits 2-5 b/l. Patient ambulates independent of any assistive aids  Radiographs: None  Last A1c:       No data to display           Assessment:   1. Pain due  to onychomycosis of toenails of both feet   2. Neuropathy    Plan:  -Consent given for treatment as described below: -Examined patient. -Patient to continue soft, supportive shoe gear daily. -Toenails 1-5 b/l were debrided in length and girth with sterile nail nippers and dremel without iatrogenic bleeding.  -Conitnue padding to right 3rd digit to minimize lesion formation. -Patient/POA to call should there be question/concern in the interim.  Return in about 3 months (around 02/11/2024).  Frederick Cook, DPM      [place] LOCATION: [address] [place] [address]                   Office [phone]   [facility] LOCATION: [address] [place], [place] [address] Office [phone]

## 2023-12-01 DIAGNOSIS — E7800 Pure hypercholesterolemia, unspecified: Secondary | ICD-10-CM | POA: Diagnosis not present

## 2023-12-01 DIAGNOSIS — Z125 Encounter for screening for malignant neoplasm of prostate: Secondary | ICD-10-CM | POA: Diagnosis not present

## 2023-12-01 DIAGNOSIS — R946 Abnormal results of thyroid function studies: Secondary | ICD-10-CM | POA: Diagnosis not present

## 2023-12-08 DIAGNOSIS — Z1331 Encounter for screening for depression: Secondary | ICD-10-CM | POA: Diagnosis not present

## 2023-12-08 DIAGNOSIS — R82998 Other abnormal findings in urine: Secondary | ICD-10-CM | POA: Diagnosis not present

## 2023-12-08 DIAGNOSIS — G309 Alzheimer's disease, unspecified: Secondary | ICD-10-CM | POA: Diagnosis not present

## 2023-12-08 DIAGNOSIS — Z0000 Encounter for general adult medical examination without abnormal findings: Secondary | ICD-10-CM | POA: Diagnosis not present

## 2023-12-08 DIAGNOSIS — Z1339 Encounter for screening examination for other mental health and behavioral disorders: Secondary | ICD-10-CM | POA: Diagnosis not present

## 2024-02-11 ENCOUNTER — Encounter: Payer: Self-pay | Admitting: Podiatry

## 2024-02-11 ENCOUNTER — Ambulatory Visit: Payer: Medicare Other | Admitting: Podiatry

## 2024-02-11 VITALS — Ht 77.0 in | Wt 182.5 lb

## 2024-02-11 DIAGNOSIS — L03031 Cellulitis of right toe: Secondary | ICD-10-CM

## 2024-02-11 DIAGNOSIS — M79674 Pain in right toe(s): Secondary | ICD-10-CM

## 2024-02-11 DIAGNOSIS — B351 Tinea unguium: Secondary | ICD-10-CM | POA: Diagnosis not present

## 2024-02-11 DIAGNOSIS — G629 Polyneuropathy, unspecified: Secondary | ICD-10-CM

## 2024-02-11 DIAGNOSIS — F0280 Dementia in other diseases classified elsewhere without behavioral disturbance: Secondary | ICD-10-CM | POA: Insufficient documentation

## 2024-02-11 DIAGNOSIS — M79675 Pain in left toe(s): Secondary | ICD-10-CM

## 2024-02-11 DIAGNOSIS — R41841 Cognitive communication deficit: Secondary | ICD-10-CM | POA: Insufficient documentation

## 2024-02-11 DIAGNOSIS — D044 Carcinoma in situ of skin of scalp and neck: Secondary | ICD-10-CM | POA: Insufficient documentation

## 2024-02-11 MED ORDER — DOXYCYCLINE HYCLATE 100 MG PO CAPS: 100.0000 mg | ORAL_CAPSULE | Freq: Two times a day (BID) | ORAL | 0 refills | Status: AC

## 2024-02-11 NOTE — Progress Notes (Signed)
Subjective:  Patient ID: Frederick Cook, male    DOB: Aug 19, 1956,  MRN: [id number]  68 y.o. male presents at risk foot care with history of peripheral neuropathy and painful, elongated thickened toenails x 10 which are symptomatic when wearing enclosed shoe gear. This interferes with his/her daily activities.  Chief Complaint  Patient presents with   Nail Problem    Pt is here for Frederick Cook PCP is Dr Frederick Cook and LOV was in December.   New problem(s): Wife notes right great toe is red at nail border with some debris. Patient states digit is not painful. They do not recall any trauma to the foot, but wife noticed toe a couple of weeks ago.  PCP is Tisovec, Adelfa Koh, MD.  No Known Allergies  Review of Systems: Negative except as noted in the HPI.   Objective:  Frederick Cook is a pleasant 68 y.o. male in NAD. AAO x 3.  Vascular Examination: Vascular status intact b/l with palpable pedal pulses. Pedal hair present b/l. CFT immediate b/l. No edema. No pain with calf compression b/l. Skin temperature gradient WNL b/l. No ischemia or gangrene noted b/l LE.  Neurological Examination: Protective sensation decreased with 10 gram monofilament b/l.  Dermatological Examination: Pedal skin with normal turgor, texture and tone b/l. Toenails 1-5 b/l thick, discolored, elongated with subungual debris and pain on dorsal palpation. No porokeratosis of right 3rd digit today.  Paronychia noted proximal nail border with dried serosanguinous drainage of the border. There is nail border hypertrophy and mild cellulitis noted distal to IPJ No ascending cellulitis noted on the foot. No warmth. No edema of digit, no odor.  Musculoskeletal Examination: Muscle strength 5/5 to b/l LE. Reducible contracted digits 2-5 b/l. Patient ambulates independent of any assistive aids  Radiographs: None  Last A1c:       No data to display         Assessment:   1. Pain due to onychomycosis of toenails of both feet   2.  Paronychia of great toe of right foot   3. Neuropathy    Plan:   Meds ordered this encounter  Medications   doxycycline (VIBRAMYCIN) 100 MG capsule    Sig: Take 1 capsule (100 mg total) by mouth 2 (two) times daily for 10 days.    Dispense:  20 capsule    Refill:  0  Patient was evaluated and treated.  Cleaned proximal nail border with sterile currette and hydrogen peroxide. No invasive procedure(s) performed. Right great toenail debrided and curretaged at distal edge utilizing sterile nail nipper and currette. Digit cleansed with alcohol and triple antibiotic ointment applied. Dispensed written instructions for once daily epsom salt soaks for 10 days. Call office if there are any concerns. Sent in Rx for doxycycline 100 mg po bid x 10 days. If toe does not respond, wife will call to schedule appointment for temporary total avulsion of right great toe nail plate. Dispense printed instructions to patient's wife. She may sign up with MyChart and I informed her she can send me pictures or a message if she has any concerns. All patient's and/or Frederick Cook's questions/concerns addressed on today's visit.  Mycotic toenails left great toe and 2-5 b/l debrided in length and girth without incident. Continue soft, supportive shoe gear daily. Report any pedal injuries to medical professional. Call office if there are any questions/concerns. -Patient/Frederick Cook to call should there be question/concern in the interim.  Return in about 3 months (around 05/10/2024).  Frederick Cook, DPM  [place]  LOCATION: [address] [place] [address]                   Office [phone]   [facility] LOCATION: [address] [place], [place] [address] Office [phone]

## 2024-02-11 NOTE — Patient Instructions (Signed)
EPSOM SALT FOOT SOAK INSTRUCTIONS  *IF YOU HAVE BEEN PRESCRIBED ANTIBIOTICS, TAKE AS INSTRUCTED UNTIL ALL ARE GONE*  Shopping List:  A. Plain epsom salt (not scented) B. Neosporin Cream with Pain Relief C. 1-inch fabric band-aids   Place 1/4 cup of epsom salts in 2 quarts of warm tap water. IF YOU ARE DIABETIC, OR HAVE NEUROPATHY, CHECK THE TEMPERATURE OF THE WATER WITH YOUR ELBOW.   Submerge your foot/feet in the solution and soak for 10-15 minutes.      3.  Next, remove your foot/feet from solution, blot dry the affected area.    4.  Apply light amount of antibiotic cream/ointment and cover with fabric band-aid .  5.  This soak should be done once a day for 7 days.   6.  Monitor for any signs/symptoms of infection such as redness, swelling, odor, drainage, increased pain, or non-healing of digit.   7.  Please do not hesitate to call the office and speak to a Nurse or Doctor if you have questions.   8.  If you experience fever, chills, nightsweats, nausea or vomiting with worsening of digit/foot, please go to the emergency room.

## 2024-03-20 ENCOUNTER — Telehealth: Payer: Self-pay

## 2024-03-20 NOTE — Telephone Encounter (Signed)
 Patient's wife left a message - He needs to have a toenail removed    - they want to see Dr. Eloy End first, and then schedule with another doc to remove the nail - please explain that it will happen much faster if they go ahead and schedule with whoever is going to do the avulsion. Can be any of the [place] docs -not sure why they have been coming to [place] - he has seen Dr. Lilian Kapur in the past - thanks

## 2024-03-21 ENCOUNTER — Encounter: Payer: Self-pay | Admitting: Podiatry

## 2024-03-21 ENCOUNTER — Ambulatory Visit: Admitting: Podiatry

## 2024-03-21 DIAGNOSIS — L603 Nail dystrophy: Secondary | ICD-10-CM

## 2024-03-21 DIAGNOSIS — L03031 Cellulitis of right toe: Secondary | ICD-10-CM

## 2024-03-21 NOTE — Patient Instructions (Signed)

## 2024-03-22 ENCOUNTER — Encounter: Payer: Self-pay | Admitting: Podiatry

## 2024-03-22 NOTE — Progress Notes (Signed)
  Subjective:  Patient ID: Frederick Cook, male    DOB: [birth year],  MRN: [id number]  Chief Complaint  Patient presents with   Toe Pain    Patient states his right hallux has redness around the bedding area of his Right hallux, it is sore to the touch in some areas around his right hallux.    68 y.o. male presents with the above complaint. History confirmed with patient.  Here with his wife who confirms the history as well.  Has had a round of antibiotics but the toe remains red.  Objective:  Physical Exam: warm, good capillary refill, no trophic changes or ulcerative lesions, normal DP and PT pulses, normal sensory exam, and dystrophic right great toenail with lamellar growth and erythema on the proximal nail fold.  Assessment:   1. Paronychia of great toe of right foot   2. Lamellar nail dystrophy      Plan:  Patient was evaluated and treated and all questions answered.  Discussed the new growth of the nail that is causing the source of the infection.  We discussed treatments including antibiotic therapy and removal of the nail plate.  I recommended the latter due to failure of previous antibiotics.  Following consent and digital block with lidocaine and Marcaine the hallux was prepped with Betadine and a tourniquet secured around the base of the toe following exsanguination.  An elevator was used to remove the nail plate.  The nailbed was debrided and irrigated.  Sterile bandage was applied.  Post care instructions given.  Return if symptoms worsen or fail to improve.

## 2024-03-28 DIAGNOSIS — K080 Exfoliation of teeth due to systemic causes: Secondary | ICD-10-CM | POA: Diagnosis not present

## 2024-04-19 DIAGNOSIS — K080 Exfoliation of teeth due to systemic causes: Secondary | ICD-10-CM | POA: Diagnosis not present

## 2024-05-22 ENCOUNTER — Encounter: Payer: Self-pay | Admitting: Podiatry

## 2024-05-22 ENCOUNTER — Ambulatory Visit: Payer: Medicare Other | Admitting: Podiatry

## 2024-05-22 VITALS — Ht 77.0 in | Wt 182.5 lb

## 2024-05-22 DIAGNOSIS — B351 Tinea unguium: Secondary | ICD-10-CM | POA: Diagnosis not present

## 2024-05-22 DIAGNOSIS — G629 Polyneuropathy, unspecified: Secondary | ICD-10-CM

## 2024-05-22 DIAGNOSIS — M79675 Pain in left toe(s): Secondary | ICD-10-CM | POA: Diagnosis not present

## 2024-05-22 DIAGNOSIS — M79674 Pain in right toe(s): Secondary | ICD-10-CM | POA: Diagnosis not present

## 2024-05-28 ENCOUNTER — Encounter: Payer: Self-pay | Admitting: Podiatry

## 2024-05-28 NOTE — Progress Notes (Signed)
  Subjective:  Patient ID: Frederick Cook, male    DOB: 12/02/1956,  MRN: [id number]  68 y.o. male presents at risk foot care with history of peripheral neuropathy and painful mycotic toenails of both feet that are difficult to trim. Pain interferes with daily activities and wearing enclosed shoe gear comfortably. He is accompanied by his wife on today's visit. Patient has total nail avulsion of right great toe performed since his last visit. Chief Complaint  Patient presents with   Nail Problem    Pt is here for [facility] PCP is Dr Tisovec and LOV was in December.    New problem(s): None   PCP is Tisovec, Kristina Pfeiffer, MD.  No Known Allergies  Review of Systems: Negative except as noted in the HPI.   Objective:  Frederick Cook is a pleasant 68 y.o. male WD, WN in NAD. AAO x 3.  Vascular Examination: Vascular status intact b/l with palpable pedal pulses. CFT immediate b/l. Pedal hair present. No edema. No pain with calf compression b/l. Skin temperature gradient WNL b/l. No varicosities noted. No cyanosis or clubbing noted.  Neurological Examination: Protective sensation decreased with 10 gram monofilament b/l.  Dermatological Examination: Pedal skin with normal turgor, texture and tone b/l. No open wounds nor interdigital macerations noted. Evidence of total matrixectomy right great toe. Toenails left great toe and 2-5 b/l thick, discolored, elongated with subungual debris and pain on dorsal palpation. No hyperkeratotic lesions noted b/l.   Musculoskeletal Examination: Muscle strength 5/5 to b/l LE.  No pain, crepitus noted b/l. No gross pedal deformities. Patient ambulates independently without assistive aids.   Radiographs: None  Last A1c:       No data to display           Assessment:   1. Pain due to onychomycosis of toenails of both feet   2. Neuropathy   Plan:  -Consent given for treatment as described below: -Examined patient. -Patient to continue soft, supportive shoe  gear daily. -Cleansed nailbed of right great toe. Applied TAO. Conitnue local wound care until healed. Contact Dr. Michalene Agee with any problems. -Toenails 2-5 bilaterally and L hallux debrided in length and girth without iatrogenic bleeding with sterile nail nipper and dremel.  -Patient/POA to call should there be question/concern in the interim.  Return in about 3 months (around 08/22/2024).  Frederick Cook, DPM      [place] LOCATION: [address], [place] [address]                   Office [phone]   [facility] LOCATION: [address] [place], [place] [address] Office [phone]

## 2024-06-13 DIAGNOSIS — R946 Abnormal results of thyroid function studies: Secondary | ICD-10-CM | POA: Diagnosis not present

## 2024-06-13 DIAGNOSIS — R350 Frequency of micturition: Secondary | ICD-10-CM | POA: Diagnosis not present

## 2024-06-20 DIAGNOSIS — G309 Alzheimer's disease, unspecified: Secondary | ICD-10-CM | POA: Diagnosis not present

## 2024-07-03 DIAGNOSIS — F02B4 Dementia in other diseases classified elsewhere, moderate, with anxiety: Secondary | ICD-10-CM | POA: Diagnosis not present

## 2024-07-03 DIAGNOSIS — E039 Hypothyroidism, unspecified: Secondary | ICD-10-CM | POA: Diagnosis not present

## 2024-07-03 DIAGNOSIS — G300 Alzheimer's disease with early onset: Secondary | ICD-10-CM | POA: Diagnosis not present

## 2024-07-07 DIAGNOSIS — R3915 Urgency of urination: Secondary | ICD-10-CM | POA: Diagnosis not present

## 2024-07-07 DIAGNOSIS — R3912 Poor urinary stream: Secondary | ICD-10-CM | POA: Diagnosis not present

## 2024-07-07 DIAGNOSIS — N401 Enlarged prostate with lower urinary tract symptoms: Secondary | ICD-10-CM | POA: Diagnosis not present

## 2024-08-01 ENCOUNTER — Ambulatory Visit: Payer: Medicare Other | Admitting: Dermatology

## 2024-08-28 ENCOUNTER — Ambulatory Visit: Admitting: Podiatry

## 2024-09-28 ENCOUNTER — Ambulatory Visit: Admitting: Podiatry

## 2024-09-28 DIAGNOSIS — M79674 Pain in right toe(s): Secondary | ICD-10-CM

## 2024-09-28 DIAGNOSIS — B351 Tinea unguium: Secondary | ICD-10-CM

## 2024-09-28 DIAGNOSIS — M79675 Pain in left toe(s): Secondary | ICD-10-CM

## 2024-09-28 DIAGNOSIS — G629 Polyneuropathy, unspecified: Secondary | ICD-10-CM

## 2024-10-06 ENCOUNTER — Encounter: Payer: Self-pay | Admitting: Podiatry

## 2024-10-06 NOTE — Progress Notes (Signed)
  Subjective:  Patient ID: Frederick Cook, male    DOB: Oct 19, 1956,  MRN: [id number]  68 y.o. male presents painful thick toenails that are difficult to trim. Pain interferes with ambulation. Aggravating factors include wearing enclosed shoe gear. Pain is relieved with periodic professional debridement. Chief Complaint  Patient presents with   Toe Pain    [facility]-Dr. Vernadine is his PCP.  He has an appointment next week for his yearly. He has a dark spot on his 2nd toe of the left foot .    New problem(s): None   PCP is Tisovec, Charlie ORN, MD  No Known Allergies  Review of Systems: Negative except as noted in the HPI.   Objective:  Frederick Cook is a pleasant 68 y.o. male WD, WN in NAD. AAO x 3.  Vascular Examination: Vascular status intact b/l with palpable pedal pulses. CFT immediate b/l. Pedal hair present. No edema. No pain with calf compression b/l. Skin temperature gradient WNL b/l. No varicosities noted. No cyanosis or clubbing noted.  Neurological Examination: Protective sensation decreased with 10 gram monofilament b/l.  Dermatological Examination: Subacute subungual hematoma 2nd toe left foot. Nailplate remains adhered.  Pedal skin with normal turgor, texture and tone b/l. No open wounds nor interdigital macerations noted. Toenails left great toe and 2-5 b/l thick, discolored, elongated with subungual debris and pain on dorsal palpation.  Musculoskeletal Examination: Muscle strength 5/5 to b/l LE.  No pain, crepitus noted b/l. No gross pedal deformities. Patient ambulates independently without assistive aids.   Radiographs: None  Assessment:   1. Pain due to onychomycosis of toenails of both feet   2. Neuropathy    Plan:  -Patient was evaluated today. All questions/concerns addressed on today's visit. -Patient to continue soft, supportive shoe gear daily. -Mycotic toenails 2-5 bilaterally and left great toe were debrided in length and girth with sterile nail nippers  and dremel without iatrogenic bleeding. -Patient/POA to call should there be question/concern in the interim.  Return in about 3 months (around 12/29/2024).  Delon LITTIE Merlin, DPM      [place] LOCATION: [address] [place] [address]                   Office [phone]   [facility] LOCATION: [address] [place], [place] [address] Office [phone]

## 2024-10-10 DIAGNOSIS — K080 Exfoliation of teeth due to systemic causes: Secondary | ICD-10-CM | POA: Diagnosis not present

## 2024-10-26 ENCOUNTER — Other Ambulatory Visit (HOSPITAL_BASED_OUTPATIENT_CLINIC_OR_DEPARTMENT_OTHER): Payer: Self-pay

## 2024-10-26 MED ORDER — FLUZONE HIGH-DOSE 0.5 ML IM SUSY
0.5000 mL | PREFILLED_SYRINGE | Freq: Once | INTRAMUSCULAR | 0 refills | Status: AC
  Filled 2024-10-26: qty 0.5, 1d supply, fill #0

## 2024-10-26 MED ORDER — COMIRNATY 30 MCG/0.3ML IM SUSY
0.3000 mL | PREFILLED_SYRINGE | Freq: Once | INTRAMUSCULAR | 0 refills | Status: AC
  Filled 2024-10-26: qty 0.3, 1d supply, fill #0

## 2025-01-11 ENCOUNTER — Ambulatory Visit: Admitting: Podiatry

## 2025-01-11 DIAGNOSIS — Z91198 Patient's noncompliance with other medical treatment and regimen for other reason: Secondary | ICD-10-CM

## 2025-01-11 NOTE — Progress Notes (Signed)
 1. Failure to attend appointment with reason given    Patient rescheduled appointment.

## 2025-02-19 ENCOUNTER — Ambulatory Visit: Admitting: Podiatry
